# Patient Record
Sex: Female | Born: 1990 | Race: Black or African American | Hispanic: No | Marital: Married | Smoking: Never smoker
Health system: Southern US, Community
[De-identification: ages and names within clinical notes are randomized; demographics above are authoritative.]

## PROBLEM LIST (undated history)

## (undated) DIAGNOSIS — K5900 Constipation, unspecified: Secondary | ICD-10-CM

## (undated) DIAGNOSIS — N979 Female infertility, unspecified: Secondary | ICD-10-CM

## (undated) DIAGNOSIS — E282 Polycystic ovarian syndrome: Secondary | ICD-10-CM

## (undated) DIAGNOSIS — R519 Headache, unspecified: Secondary | ICD-10-CM

## (undated) DIAGNOSIS — E538 Deficiency of other specified B group vitamins: Secondary | ICD-10-CM

## (undated) DIAGNOSIS — M2550 Pain in unspecified joint: Secondary | ICD-10-CM

## (undated) DIAGNOSIS — N946 Dysmenorrhea, unspecified: Secondary | ICD-10-CM

## (undated) DIAGNOSIS — N920 Excessive and frequent menstruation with regular cycle: Secondary | ICD-10-CM

## (undated) DIAGNOSIS — D259 Leiomyoma of uterus, unspecified: Secondary | ICD-10-CM

## (undated) DIAGNOSIS — B029 Zoster without complications: Secondary | ICD-10-CM

## (undated) DIAGNOSIS — E559 Vitamin D deficiency, unspecified: Secondary | ICD-10-CM

## (undated) HISTORY — DX: Excessive and frequent menstruation with regular cycle: N92.0

## (undated) HISTORY — DX: Vitamin D deficiency, unspecified: E55.9

## (undated) HISTORY — DX: Deficiency of other specified B group vitamins: E53.8

## (undated) HISTORY — DX: Dysmenorrhea, unspecified: N94.6

## (undated) HISTORY — DX: Leiomyoma of uterus, unspecified: D25.9

## (undated) HISTORY — DX: Pain in unspecified joint: M25.50

## (undated) HISTORY — DX: Constipation, unspecified: K59.00

## (undated) HISTORY — DX: Zoster without complications: B02.9

## (undated) HISTORY — DX: Headache, unspecified: R51.9

## (undated) HISTORY — DX: Female infertility, unspecified: N97.9

---

## 2011-11-29 DIAGNOSIS — B029 Zoster without complications: Secondary | ICD-10-CM

## 2011-11-29 HISTORY — DX: Zoster without complications: B02.9

## 2017-02-02 ENCOUNTER — Other Ambulatory Visit: Payer: Self-pay | Admitting: Obstetrics and Gynecology

## 2017-02-02 DIAGNOSIS — N644 Mastodynia: Secondary | ICD-10-CM

## 2017-02-16 ENCOUNTER — Ambulatory Visit
Admission: RE | Admit: 2017-02-16 | Discharge: 2017-02-16 | Disposition: A | Discharge status: Home / Self Care | Payer: BLUE CROSS/BLUE SHIELD | Source: Ambulatory Visit | Attending: Obstetrics and Gynecology | Admitting: Obstetrics and Gynecology

## 2017-02-16 DIAGNOSIS — N644 Mastodynia: Secondary | ICD-10-CM

## 2017-08-24 ENCOUNTER — Encounter: Payer: Self-pay | Admitting: Family Medicine

## 2017-08-24 ENCOUNTER — Ambulatory Visit (INDEPENDENT_AMBULATORY_CARE_PROVIDER_SITE_OTHER): Payer: BLUE CROSS/BLUE SHIELD | Admitting: Family Medicine

## 2017-08-24 VITALS — BP 102/80 | HR 77 | Temp 98.1°F | Ht 66.0 in | Wt 216.7 lb

## 2017-08-24 DIAGNOSIS — Z7689 Persons encountering health services in other specified circumstances: Secondary | ICD-10-CM | POA: Diagnosis not present

## 2017-08-24 DIAGNOSIS — J302 Other seasonal allergic rhinitis: Secondary | ICD-10-CM

## 2017-08-24 NOTE — Progress Notes (Signed)
Patient presents to clinic today to establish care.  SUBJECTIVE: PMH: Pt is a 26 yo AAF with pmh sig for allergies.  Pt formally seen by [facility] in [place].  [facility] [place] where she had labs done in July.  History of allergies: -Symptoms occur a few times per year -Patient takes Zyrtec when necessary -Currently asymptomatic  Past surgical history: None  Allergies: NKDA  Social: Patient has been married for 2 years. She and her husband have lived in [place] for the last 2 years that have been going back to [place] for medical care. Patient does not have any children. 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 Patient attended [employer] for undergrad and [employer] for grad school. Patient denies tobacco, alcohol, drug use.  Family medical history: Mom: AAW Dad: HTN, renal cancer, DM Sis: AAW MGM: No issues Patient's maternal aunt: Breast cancer MGF: HTN, DM PGM: Lung cancer PGF: Not sure  Health Maintenance: Dental --Dr. Evette Doffing [facility] --my eye doctor on [address] Immunizations --declined flu shot, does not typically get Colonoscopy --N/a Mammogram --N/a PAP -- this year Bone Density -- N/a   Past Medical History:  Diagnosis Date  . Shingles 2013    History reviewed. No pertinent surgical history.  No current outpatient prescriptions on file prior to visit.   No current facility-administered medications on file prior to visit.     No Known Allergies  Family History  Problem Relation Age of Onset  . Breast cancer Maternal Aunt   . Breast cancer Paternal Aunt   . Kidney cancer Father   . Diabetes Father   . Hearing loss Father   . Hypertension Father     Social History   Social History  . Marital status: Married    Spouse name: N/A  . Number of children: N/A  . Years of education: N/A   Occupational History  . Not on file.    Social History Main Topics  . Smoking status: Never Smoker  . Smokeless tobacco: Never Used  . Alcohol use No  . Drug use: No  . Sexual activity: Not on file   Other Topics Concern  . Not on file   Social History Narrative  . No narrative on file    ROS General: Denies fever, chills, night sweats, changes in weight, changes in appetite HEENT: Denies headaches, ear pain, changes in vision, rhinorrhea, sore throat CV: Denies CP, palpitations, SOB, orthopnea Pulm: Denies SOB, cough, wheezing GI: Denies abdominal pain, nausea, vomiting, diarrhea, constipation GU: Denies dysuria, hematuria, frequency, vaginal discharge Msk: Denies muscle cramps, joint pains Neuro: Denies weakness, numbness, tingling Skin: Denies rashes, bruising Psych: Denies depression, anxiety, hallucinations  BP 102/80 (BP Location: Right Arm, Patient Position: Sitting, Cuff Size: Normal)   Pulse 77   Temp 98.1 F (36.7 C) (Oral)   Ht 5\' 6"  (1.676 m)   Wt 216 lb 11.2 oz (98.3 kg)   LMP 07/27/2017 (Exact Date)   BMI 34.98 kg/m   Physical Exam  Gen. Pleasant, well developed, well-nourished, in NAD HEENT - [place]/AT, PERRL, EOMI, conjunctive clear, no scleral icterus, no nasal drainage, pharynx without erythema or exudate. B/l TMs normal. Neck: No JVD, no thyromegaly Lungs: no use of accessory muscles, CTAB, no wheezes, rales or rhonchi Cardiovascular: RRR,  No r/g/m, no peripheral edema Abdomen: BS present, soft, nontender,nondistended, no hepatosplenomegaly Musculoskeletal: No deformities, moves all four extremities, no cyanosis or clubbing,  normal tone Neuro:  A&Ox3, CN II-XII intact, normal gait Skin:  Warm, dry, intact, no lesions Psych: normal affect, mood appropriate  No results found for this or any previous visit (from the past 2160 hour(s)).  Assessment/Plan: Seasonal allergic rhinitis, unspecified trigger -Continue Zyrtec prn -given handout  Encounter to establish care -pt to sign records  release form  F/u prn

## 2017-09-28 ENCOUNTER — Ambulatory Visit (INDEPENDENT_AMBULATORY_CARE_PROVIDER_SITE_OTHER): Payer: BLUE CROSS/BLUE SHIELD | Admitting: Family Medicine

## 2017-09-28 VITALS — BP 100/70 | HR 77 | Temp 98.1°F | Wt 218.1 lb

## 2017-09-28 DIAGNOSIS — S161XXA Strain of muscle, fascia and tendon at neck level, initial encounter: Secondary | ICD-10-CM | POA: Diagnosis not present

## 2017-09-28 MED ORDER — PREDNISONE 10 MG PO TABS: ORAL_TABLET | ORAL | 0 refills | Status: DC

## 2017-09-28 MED ORDER — CYCLOBENZAPRINE HCL 5 MG PO TABS: 5.0000 mg | ORAL_TABLET | Freq: Every day | ORAL | 0 refills | Status: DC

## 2017-09-28 MED ORDER — MELOXICAM 7.5 MG PO TABS: 7.5000 mg | ORAL_TABLET | Freq: Every day | ORAL | 0 refills | Status: DC

## 2017-09-28 NOTE — Patient Instructions (Addendum)
Muscle Strain A muscle strain (pulled muscle) happens when a muscle is stretched beyond normal length. It happens when a sudden, violent force stretches your muscle too far. Usually, a few of the fibers in your muscle are torn. Muscle strain is common in athletes. Recovery usually takes 1-2 weeks. Complete healing takes 5-6 weeks. Follow these instructions at home:  Follow the PRICE method of treatment to help your injury get better. Do this the first 2-3 days after the injury: ? Protect. Protect the muscle to keep it from getting injured again. ? Rest. Limit your activity and rest the injured body part. ? Ice. Put ice in a plastic bag. Place a towel between your skin and the bag. Then, apply the ice and leave it on from 15-20 minutes each hour. After the third day, switch to moist heat packs. ? Compression. Use a splint or elastic bandage on the injured area for comfort. Do not put it on too tightly. ? Elevate. Keep the injured body part above the level of your heart.  Only take medicine as told by your doctor.  Warm up before doing exercise to prevent future muscle strains. Contact a doctor if:  You have more pain or puffiness (swelling) in the injured area.  You feel numbness, tingling, or notice a loss of strength in the injured area. This information is not intended to replace advice given to you by your health care provider. Make sure you discuss any questions you have with your health care provider. Document Released: 08/23/2008 Document Revised: 04/21/2016 Document Reviewed: 06/13/2013 Elsevier Interactive Patient Education  2017 Elsevier Inc[employer]  Cervical Strain and Sprain Rehab Ask your health care provider which exercises are safe for you. Do exercises exactly as told by your health care provider and adjust them as directed. It is normal to feel mild stretching, pulling, tightness, or discomfort as you do these exercises, but you should stop right away if you feel sudden pain or your  pain gets worse.Do not begin these exercises until told by your health care provider. Stretching and range of motion exercises These exercises warm up your muscles and joints and improve the movement and flexibility of your neck. These exercises also help to relieve pain, numbness, and tingling. Exercise A: Cervical side bend  1. Using good posture, sit on a stable chair or stand up. 2. Without moving your shoulders, slowly tilt your left / right ear to your shoulder until you feel a stretch in your neck muscles. You should be looking straight ahead. 3. Hold for __________ seconds. 4. Repeat with the other side of your neck. Repeat __________ times. Complete this exercise __________ times a day. Exercise B: Cervical rotation  1. Using good posture, sit on a stable chair or stand up. 2. Slowly turn your head to the side as if you are looking over your left / right shoulder. ? Keep your eyes level with the ground. ? Stop when you feel a stretch along the side and the back of your neck. 3. Hold for __________ seconds. 4. Repeat this by turning to your other side. Repeat __________ times. Complete this exercise __________ times a day. Exercise C: Thoracic extension and pectoral stretch 1. Roll a towel or a small blanket so it is about 4 inches (10 cm) in diameter. 2. Lie down on your back on a firm surface. 3. Put the towel lengthwise, under your spine in the middle of your back. It should not be not under your shoulder blades. The towel   should line up with your spine from your middle back to your lower back. 4. Put your hands behind your head and let your elbows fall out to your sides. 5. Hold for __________ seconds. Repeat __________ times. Complete this exercise __________ times a day. Strengthening exercises These exercises build strength and endurance in your neck. Endurance is the ability to use your muscles for a long time, even after your muscles get tired. Exercise D: Upper cervical  flexion, isometric 1. Lie on your back with a thin pillow behind your head and a small rolled-up towel under your neck. 2. Gently tuck your chin toward your chest and nod your head down to look toward your feet. Do not lift your head off the pillow. 3. Hold for __________ seconds. 4. Release the tension slowly. Relax your neck muscles completely before you repeat this exercise. Repeat __________ times. Complete this exercise __________ times a day. Exercise E: Cervical extension, isometric  1. Stand about 6 inches (15 cm) away from a wall, with your back facing the wall. 2. Place a soft object, about 6-8 inches (15-20 cm) in diameter, between the back of your head and the wall. A soft object could be a small pillow, a ball, or a folded towel. 3. Gently tilt your head back and press into the soft object. Keep your jaw and forehead relaxed. 4. Hold for __________ seconds. 5. Release the tension slowly. Relax your neck muscles completely before you repeat this exercise. Repeat __________ times. Complete this exercise __________ times a day. Posture and body mechanics  Body mechanics refers to the movements and positions of your body while you do your daily activities. Posture is part of body mechanics. Good posture and healthy body mechanics can help to relieve stress in your body's tissues and joints. Good posture means that your spine is in its natural S-curve position (your spine is neutral), your shoulders are pulled back slightly, and your head is not tipped forward. The following are general guidelines for applying improved posture and body mechanics to your everyday activities. Standing  When standing, keep your spine neutral and keep your feet about hip-width apart. Keep a slight bend in your knees. Your ears, shoulders, and hips should line up.  When you do a task in which you stand in one place for a long time, place one foot up on a stable object that is 2-4 inches (5-10 cm) high, such  as a footstool. This helps keep your spine neutral. Sitting   When sitting, keep your spine neutral and your keep feet flat on the floor. Use a footrest, if necessary, and keep your thighs parallel to the floor. Avoid rounding your shoulders, and avoid tilting your head forward.  When working at a desk or a computer, keep your desk at a height where your hands are slightly lower than your elbows. Slide your chair under your desk so you are close enough to maintain good posture.  When working at a computer, place your monitor at a height where you are looking straight ahead and you do not have to tilt your head forward or downward to look at the screen. Resting When lying down and resting, avoid positions that are most painful for you. Try to support your neck in a neutral position. You can use a contour pillow or a small rolled-up towel. Your pillow should support your neck but not push on it. This information is not intended to replace advice given to you by your health care   provider. Make sure you discuss any questions you have with your health care provider. Document Released: 11/14/2005 Document Revised: 07/21/2016 Document Reviewed: 10/21/2015 Elsevier Interactive Patient Education  2018 Elsevier Inc.  

## 2017-09-28 NOTE — Progress Notes (Signed)
Subjective:    Patient ID: Paige Stout, female    DOB: Dec 26, 1990, 26 y.o.   MRN: [id number]  Chief Complaint  Patient presents with  . Neck Pain    HPI Patient was seen today for neck pain x 1 month.  Pt does not Recall injury. Pt has tried heat, ibuprofen. They initially started working been stopped. Pt reports difficulty turning her head to see in her blind spot while driving. Pt denies fever, chills, headache, nausea vomiting, dizziness. Pt endorses muscle in back of neck feeling tight, pain at the base of her skull with turning her neck.  Pt also expresses difficulty getting comfortable in bed at night.  Past Medical History:  Diagnosis Date  . Shingles 2013    No Known Allergies  ROS General: Denies fever, chills, night sweats, changes in weight, changes in appetite HEENT: Denies headaches, ear pain, changes in vision, rhinorrhea, sore throat CV: Denies CP, palpitations, SOB, orthopnea Pulm: Denies SOB, cough, wheezing GI: Denies abdominal pain, nausea, vomiting, diarrhea, constipation GU: Denies dysuria, hematuria, frequency, vaginal discharge Msk: Denies muscle cramps, joint pains +neck pain Neuro: Denies weakness, numbness, tingling Skin: Denies rashes, bruising Psych: Denies depression, anxiety, hallucinations     Objective:    Blood pressure 100/70, pulse 77, temperature 98.1 F (36.7 C), temperature source Oral, weight 218 lb 1.6 oz (98.9 kg).   Gen. Pleasant, well-nourished, in no distress, normal affect  HEENT: [place]/AT, face symmetric, conjunctiva clear, no scleral icterus, PERRLA, EOMI, nares patent Lungs: no accessory muscle use, CTAB, no wheezes or rales Cardiovascular: RRR, no m/r/g, no peripheral edema Musculoskeletal: No deformities, no cyanosis or clubbing, normal tone.  No TTP of spine.  Shoulders symmetric, No TTP of trapezius.  Trapezius muscle tense. Neuro:  A&Ox3, CN II-XII intact, normal gait Skin:  Warm, no lesions/ rash   Wt Readings from  Last 3 Encounters:  09/28/17 218 lb 1.6 oz (98.9 kg)  08/24/17 216 lb 11.2 oz (98.3 kg)    No results found for: WBC, HGB, HCT, PLT, GLUCOSE, CHOL, TRIG, HDL, LDLDIRECT, LDLCALC, ALT, AST, NA, K, CL, CREATININE, BUN, CO2, TSH, PSA, INR, GLUF, HGBA1C, MICROALBUR  Assessment/Plan:  Strain of neck muscle, initial encounter  -discussed continuing ways to reduce stress. -Will proceed with muscle relaxer at night, prednisone 5 day taper, and mobic prn. -if symptoms continue or become worse RTC. -will consider xray if symptoms continue. - Plan: predniSONE (DELTASONE) 10 MG tablet, cyclobenzaprine (FLEXERIL) 5 MG tablet, meloxicam (MOBIC) 7.5 MG tablet

## 2018-07-27 ENCOUNTER — Telehealth: Payer: Self-pay | Admitting: Family Medicine

## 2018-07-27 NOTE — Telephone Encounter (Signed)
Okay 

## 2018-07-27 NOTE — Telephone Encounter (Signed)
See request °

## 2018-07-27 NOTE — Telephone Encounter (Signed)
Please advise on taking patient.

## 2018-07-27 NOTE — Telephone Encounter (Signed)
ok 

## 2018-07-27 NOTE — Telephone Encounter (Signed)
Copied from [place] [id number]. Topic: Quick Communication - See Telephone Encounter >> Jul 27, 2018  1:03 PM Hewitt Shorts wrote: Pt is wanting to change PCP from [facility] to [facility] because she wants to try the weight loss programs that wallace  Best number is [phone]

## 2018-07-28 NOTE — Telephone Encounter (Signed)
Can you please schedule?

## 2018-07-31 NOTE — Telephone Encounter (Signed)
Called pt and left vm for pt to schedule TOC appt.

## 2018-09-18 DIAGNOSIS — N390 Urinary tract infection, site not specified: Secondary | ICD-10-CM | POA: Diagnosis not present

## 2018-09-18 DIAGNOSIS — R102 Pelvic and perineal pain: Secondary | ICD-10-CM | POA: Diagnosis not present

## 2018-09-18 DIAGNOSIS — M545 Low back pain: Secondary | ICD-10-CM | POA: Diagnosis not present

## 2018-09-19 DIAGNOSIS — N946 Dysmenorrhea, unspecified: Secondary | ICD-10-CM

## 2018-09-19 HISTORY — DX: Dysmenorrhea, unspecified: N94.6

## 2018-09-28 DIAGNOSIS — R102 Pelvic and perineal pain: Secondary | ICD-10-CM | POA: Diagnosis not present

## 2018-09-28 DIAGNOSIS — N946 Dysmenorrhea, unspecified: Secondary | ICD-10-CM | POA: Diagnosis not present

## 2018-09-28 DIAGNOSIS — N920 Excessive and frequent menstruation with regular cycle: Secondary | ICD-10-CM | POA: Diagnosis not present

## 2018-09-29 DIAGNOSIS — D259 Leiomyoma of uterus, unspecified: Secondary | ICD-10-CM

## 2018-09-29 DIAGNOSIS — R102 Pelvic and perineal pain: Secondary | ICD-10-CM | POA: Insufficient documentation

## 2018-09-29 HISTORY — DX: Leiomyoma of uterus, unspecified: D25.9

## 2018-09-30 NOTE — Progress Notes (Deleted)
   Paige Stout is a 27 y.o. female is here to establish care.  History of Present Illness:   HPI: See Assessment and Plan section for Problem Based Charting of issues discussed today.   Health Maintenance Due  Topic Date Due  . HIV Screening  12/31/2003  . TETANUS/TDAP  12/31/2007  . INFLUENZA VACCINE  06/28/2018   Depression screen PHQ 2/9 08/24/2017  Decreased Interest 0  Down, Depressed, Hopeless 0  PHQ - 2 Score 0   PMHx, SurgHx, SocialHx, FamHx, Medications, and Allergies were reviewed in the Visit Navigator and updated as appropriate.  There are no active problems to display for this patient.  Social History   Tobacco Use  . Smoking status: Never Smoker  . Smokeless tobacco: Never Used  Substance Use Topics  . Alcohol use: No  . Drug use: No   Current Medications and Allergies:   Current Outpatient Medications:  .  cyclobenzaprine (FLEXERIL) 5 MG tablet, Take 1 tablet (5 mg total) by mouth at bedtime., Disp: 15 tablet, Rfl: 0 .  meloxicam (MOBIC) 7.5 MG tablet, Take 1 tablet (7.5 mg total) by mouth daily., Disp: 30 tablet, Rfl: 0 .  predniSONE (DELTASONE) 10 MG tablet, Take 5 tabs on day 1, 4 tabs on day 2, 3 tabs on day 3, 2 tabs on day 4, 1 tab on day 5., Disp: 15 tablet, Rfl: 0  No Known Allergies Review of Systems   Pertinent items are noted in the HPI. Otherwise, ROS is negative.  Vitals:  There were no vitals filed for this visit.   There is no height or weight on file to calculate BMI.  Physical Exam:   Physical Exam  No results found for this or any previous visit.  Assessment and Plan:   No problem-specific Assessment & Plan notes found for this encounter.   No orders of the defined types were placed in this encounter.   No orders of the defined types were placed in this encounter.   . Reviewed expectations re: course of current medical issues. . Discussed self-management of symptoms. . Outlined signs and symptoms indicating need for  more acute intervention. . Patient verbalized understanding and all questions were answered. Marland Kitchen Health Maintenance issues including appropriate healthy diet, exercise, and smoking avoidance were discussed with patient. . See orders for this visit as documented in the electronic medical record. . Patient received an After Visit Summary.  Briscoe Deutscher, DO [place] 09/30/2018

## 2018-10-01 ENCOUNTER — Encounter: Payer: BLUE CROSS/BLUE SHIELD | Admitting: Family Medicine

## 2018-10-02 ENCOUNTER — Encounter

## 2018-10-02 ENCOUNTER — Ambulatory Visit: Payer: Self-pay | Admitting: Family Medicine

## 2018-10-02 ENCOUNTER — Encounter: Payer: Self-pay | Admitting: Family Medicine

## 2018-10-02 VITALS — BP 122/84 | HR 88 | Temp 98.0°F | Ht 67.0 in | Wt 217.4 lb

## 2018-10-02 DIAGNOSIS — Z13220 Encounter for screening for lipoid disorders: Secondary | ICD-10-CM

## 2018-10-02 DIAGNOSIS — R5383 Other fatigue: Secondary | ICD-10-CM

## 2018-10-02 DIAGNOSIS — E282 Polycystic ovarian syndrome: Secondary | ICD-10-CM

## 2018-10-02 DIAGNOSIS — E66811 Obesity, class 1: Secondary | ICD-10-CM

## 2018-10-02 DIAGNOSIS — R5381 Other malaise: Secondary | ICD-10-CM | POA: Diagnosis not present

## 2018-10-02 DIAGNOSIS — E669 Obesity, unspecified: Secondary | ICD-10-CM

## 2018-10-02 DIAGNOSIS — R739 Hyperglycemia, unspecified: Secondary | ICD-10-CM | POA: Diagnosis not present

## 2018-10-02 DIAGNOSIS — Z9189 Other specified personal risk factors, not elsewhere classified: Secondary | ICD-10-CM

## 2018-10-02 LAB — POCT GLYCOSYLATED HEMOGLOBIN (HGB A1C): Hemoglobin A1C: 5.5 % (ref 4.0–5.6)

## 2018-10-02 LAB — CBC WITH DIFFERENTIAL/PLATELET
Basophils Absolute: 0.1 10*3/uL (ref 0.0–0.1)
Basophils Relative: 0.7 % (ref 0.0–3.0)
Eosinophils Absolute: 0.2 10*3/uL (ref 0.0–0.7)
Eosinophils Relative: 2.1 % (ref 0.0–5.0)
HCT: 37.3 % (ref 36.0–46.0)
Hemoglobin: 12.6 g/dL (ref 12.0–15.0)
Lymphocytes Relative: 40 % (ref 12.0–46.0)
Lymphs Abs: 4.1 10*3/uL — ABNORMAL HIGH (ref 0.7–4.0)
MCHC: 33.6 g/dL (ref 30.0–36.0)
MCV: 85.9 fl (ref 78.0–100.0)
Monocytes Absolute: 0.5 10*3/uL (ref 0.1–1.0)
Monocytes Relative: 4.5 % (ref 3.0–12.0)
Neutro Abs: 5.4 10*3/uL (ref 1.4–7.7)
Neutrophils Relative %: 52.7 % (ref 43.0–77.0)
Platelets: 351 10*3/uL (ref 150.0–400.0)
RBC: 4.35 Mil/uL (ref 3.87–5.11)
RDW: 13.8 % (ref 11.5–15.5)
WBC: 10.3 10*3/uL (ref 4.0–10.5)

## 2018-10-02 LAB — COMPREHENSIVE METABOLIC PANEL
ALT: 24 U/L (ref 0–35)
AST: 23 U/L (ref 0–37)
Albumin: 4.3 g/dL (ref 3.5–5.2)
Alkaline Phosphatase: 60 U/L (ref 39–117)
BUN: 6 mg/dL (ref 6–23)
CO2: 25 mEq/L (ref 19–32)
Calcium: 9.5 mg/dL (ref 8.4–10.5)
Chloride: 102 mEq/L (ref 96–112)
Creatinine, Ser: 0.61 mg/dL (ref 0.40–1.20)
GFR: 148.35 mL/min (ref >60.00)
Glucose, Bld: 94 mg/dL (ref 70–99)
Potassium: 4.2 mEq/L (ref 3.5–5.1)
Sodium: 136 mEq/L (ref 135–145)
Total Bilirubin: 0.5 mg/dL (ref 0.2–1.2)
Total Protein: 7.5 g/dL (ref 6.0–8.3)

## 2018-10-02 LAB — LIPID PANEL
Cholesterol: 181 mg/dL (ref 0–200)
HDL: 56.3 mg/dL (ref >39.00)
LDL Cholesterol: 98 mg/dL (ref 0–99)
NonHDL: 125.04
Total CHOL/HDL Ratio: 3
Triglycerides: 135 mg/dL (ref 0.0–149.0)
VLDL: 27 mg/dL (ref 0.0–40.0)

## 2018-10-02 LAB — TSH: TSH: 1.26 u[IU]/mL (ref 0.35–4.50)

## 2018-10-02 NOTE — Progress Notes (Signed)
Paige Stout is a 27 y.o. female is here to transfer care.  History of Present Illness:   HPI: See Assessment and Plan section for Problem Based Charting of issues discussed today.   There are no preventive care reminders to display for this patient.   Depression screen [facility] 2/9 10/04/2018 08/24/2017  Decreased Interest 0 0  Down, Depressed, Hopeless 0 0  PHQ - 2 Score 0 0   PMHx, SurgHx, SocialHx, FamHx, Medications, and Allergies were reviewed in the Visit Navigator and updated as appropriate.   Patient Active Problem List   Diagnosis Date Noted  . Menorrhagia 10/04/2018  . At risk for infertility 10/04/2018  . Obesity (BMI 30.0-34.9) 10/04/2018  . PCOS (polycystic ovarian syndrome) suspected 10/04/2018  . Uterine leiomyoma 09/29/2018  . Dysmenorrhea 09/19/2018   Social History   Tobacco Use  . Smoking status: Never Smoker  . Smokeless tobacco: Never Used  Substance Use Topics  . Alcohol use: No  . Drug use: No   Current Medications and Allergies:   Current Outpatient Medications:  .  phentermine (ADIPEX-P) 37.5 MG tablet, Take 1 tablet (37.5 mg total) by mouth daily before breakfast., Disp: 30 tablet, Rfl: 0 .  Prenatal Vit-Fe Fumarate-FA (PRENATAL VITAMIN PLUS LOW IRON PO), Take 1 tablet by mouth daily., Disp: , Rfl:   No Known Allergies Review of Systems   Pertinent items are noted in the HPI. Otherwise, ROS is negative.  Vitals:   Vitals:   10/02/18 0932  BP: 122/84  Pulse: 88  Temp: 98 F (36.7 C)  TempSrc: Oral  SpO2: 97%  Weight: 217 lb 6.4 oz (98.6 kg)  Height: 5\' 7"  (1.702 m)     Body mass index is 34.05 kg/m.  Physical Exam:   Physical Exam  Constitutional: She appears well-nourished.  HENT:  Head: Normocephalic and atraumatic.  Eyes: Pupils are equal, round, and reactive to light. EOM are normal.  Neck: Normal range of motion. Neck supple.  Cardiovascular: Normal rate, regular rhythm, normal heart sounds and intact distal pulses.    Pulmonary/Chest: Effort normal.  Abdominal: Soft.  Skin: Skin is warm.  Psychiatric: She has a normal mood and affect. Her behavior is normal.  Nursing note and vitals reviewed.  Results for orders placed or performed in visit on 10/02/18  CBC with Differential/Platelet  Result Value Ref Range   WBC 10.3 4.0 - 10.5 K/uL   RBC 4.35 3.87 - 5.11 Mil/uL   Hemoglobin 12.6 12.0 - 15.0 g/dL   HCT 37.3 36.0 - 46.0 %   MCV 85.9 78.0 - 100.0 fl   MCHC 33.6 30.0 - 36.0 g/dL   RDW 13.8 11.5 - 15.5 %   Platelets 351.0 150.0 - 400.0 K/uL   Neutrophils Relative % 52.7 43.0 - 77.0 %   Lymphocytes Relative 40.0 12.0 - 46.0 %   Monocytes Relative 4.5 3.0 - 12.0 %   Eosinophils Relative 2.1 0.0 - 5.0 %   Basophils Relative 0.7 0.0 - 3.0 %   Neutro Abs 5.4 1.4 - 7.7 K/uL   Lymphs Abs 4.1 (H) 0.7 - 4.0 K/uL   Monocytes Absolute 0.5 0.1 - 1.0 K/uL   Eosinophils Absolute 0.2 0.0 - 0.7 K/uL   Basophils Absolute 0.1 0.0 - 0.1 K/uL  Comprehensive metabolic panel  Result Value Ref Range   Sodium 136 135 - 145 mEq/L   Potassium 4.2 3.5 - 5.1 mEq/L   Chloride 102 96 - 112 mEq/L   CO2 25 19 - 32  mEq/L   Glucose, Bld 94 70 - 99 mg/dL   BUN 6 6 - 23 mg/dL   Creatinine, Ser 0.61 0.40 - 1.20 mg/dL   Total Bilirubin 0.5 0.2 - 1.2 mg/dL   Alkaline Phosphatase 60 39 - 117 U/L   AST 23 0 - 37 U/L   ALT 24 0 - 35 U/L   Total Protein 7.5 6.0 - 8.3 g/dL   Albumin 4.3 3.5 - 5.2 g/dL   Calcium 9.5 8.4 - 10.5 mg/dL   GFR 148.35 >60.00 mL/min  Lipid panel  Result Value Ref Range   Cholesterol 181 0 - 200 mg/dL   Triglycerides 135.0 0.0 - 149.0 mg/dL   HDL 56.30 >39.00 mg/dL   VLDL 27.0 0.0 - 40.0 mg/dL   LDL Cholesterol 98 0 - 99 mg/dL   Total CHOL/HDL Ratio 3    NonHDL 125.04   TSH  Result Value Ref Range   TSH 1.26 0.35 - 4.50 uIU/mL  POCT glycosylated hemoglobin (Hb A1C)  Result Value Ref Range   Hemoglobin A1C 5.5 4.0 - 5.6 %   HbA1c POC (<> result, manual entry)     HbA1c, POC (prediabetic  range)     HbA1c, POC (controlled diabetic range)     Assessment and Plan:   Obesity (BMI 30.0-34.9) Patient complains of obesity. Patient cites wanting to become pregnant and overall health as reasons for wanting to lose weight.  Wt Readings from Last 3 Encounters:  10/02/18 217 lb 6.4 oz (98.6 kg)  09/28/17 218 lb 1.6 oz (98.9 kg)  08/24/17 216 lb 11.2 oz (98.3 kg)   Usual eating pattern includes: Pescatarian, admits to eating sweets.   Usual physical activity includes no dedicated exercise. Current life stressors: none.  Associated medical conditions: hirsutism and irregular menses.  Associated medications: none.   Cardiovascular risk factors besides obesity: obesity (BMI >= 30 kg/m2).  History of eating disorders: none.  Previous treatments for obesity include: self-directed dieting.   Contraindications to weight loss: none. Patient readiness to commit to diet and activity changes: excellent. Barriers to weight loss: none.  I assessed Franchon to be in an action stage with respect to weight loss.   Plan: 1. Diagnostic studies to rule out secondary causes of obesity: see orders. 2. General patient education:  a. Importance of long-term maintenance treatment in weight loss. b. Use non-food self-rewards to reinforce behavior changes. c. Elicit support from others; identify saboteurs. 3. Diet interventions:  a. Risks of dieting were reviewed, including fatigue, temporary hair loss, gallstone formation, gout, and with very low calorie diets, electrolyte abnormalities, nutrient inadequacies, and loss of lean body mass. 4. Exercise intervention:  a. Informal measures, e.g. taking stairs instead of elevator. b. Formal exercise regimen options discussed as well. 5. Other behavioral treatment: stress management. 6. Other treatment: Medication: Phentermine. 7. Patient to keep a weight log that we will review at follow up. 8. Follow up: 1 month and as needed.  At risk for  infertility [place] Criteria:  [x]   Oligo and/or anovulation [x]   Biochemical and/or clinical signs of hyperandrogenism      Clinical: Acne, hirsutism, acanthosis nigricans      Biochemical: Total T > 70 ng/dL, Androstenedione > 245 ng/dL, DHEA-S > 248 ug/dL []   Polycystic ovaries (> 12 follicles, 2-9 mm diameter each ovary/ ovarian volume > 10 cc)  PCOS is a syndrome consisting of: 1. weight gain 2. insulin resistance (and therefore a higher risk of developing diabetes later in life) 3. acne  4. hirsutism 5. irregular menstrual cycles 6. decreased fertility  We also discussed about the fact that the treatment is usually targeted to addressing the problem that concerns the patient the most: acne/hirsutism, weight gain, or fertility, but there is no single treatment for PCOS. In this patient, will focus on rapid weight loss. Explained that Phentermine is containdicated in pregnancy, so she will stop at the first sign of pregnancy.   Orders Placed This Encounter  Procedures  . CBC with Differential/Platelet  . Comprehensive metabolic panel  . Lipid panel  . TSH  . Insulin, Free (Bioactive)  . POCT glycosylated hemoglobin (Hb A1C)   Meds ordered this encounter  Medications  . phentermine (ADIPEX-P) 37.5 MG tablet    Sig: Take 1 tablet (37.5 mg total) by mouth daily before breakfast.    Dispense:  30 tablet    Refill:  0    . Reviewed expectations re: course of current medical issues. . Discussed self-management of symptoms. . Outlined signs and symptoms indicating need for more acute intervention. . Patient verbalized understanding and all questions were answered. Marland Kitchen Health Maintenance issues including appropriate healthy diet, exercise, and smoking avoidance were discussed with patient. . See orders for this visit as documented in the electronic medical record. . Patient received an After Visit Summary.  Briscoe Deutscher, DO [place], [facility] 10/04/2018

## 2018-10-04 ENCOUNTER — Encounter: Payer: Self-pay | Admitting: Family Medicine

## 2018-10-04 DIAGNOSIS — E669 Obesity, unspecified: Secondary | ICD-10-CM | POA: Insufficient documentation

## 2018-10-04 DIAGNOSIS — E282 Polycystic ovarian syndrome: Secondary | ICD-10-CM | POA: Insufficient documentation

## 2018-10-04 DIAGNOSIS — N920 Excessive and frequent menstruation with regular cycle: Secondary | ICD-10-CM

## 2018-10-04 DIAGNOSIS — Z9189 Other specified personal risk factors, not elsewhere classified: Secondary | ICD-10-CM | POA: Insufficient documentation

## 2018-10-04 HISTORY — DX: Excessive and frequent menstruation with regular cycle: N92.0

## 2018-10-04 MED ORDER — PHENTERMINE HCL 37.5 MG PO TABS: 37.5000 mg | ORAL_TABLET | Freq: Every day | ORAL | 0 refills | Status: DC

## 2018-10-04 NOTE — Assessment & Plan Note (Signed)
Patient complains of obesity. Patient cites wanting to become pregnant and overall health as reasons for wanting to lose weight.  Wt Readings from Last 3 Encounters:  10/02/18 217 lb 6.4 oz (98.6 kg)  09/28/17 218 lb 1.6 oz (98.9 kg)  08/24/17 216 lb 11.2 oz (98.3 kg)   Usual eating pattern includes: Pescatarian, admits to eating sweets.   Usual physical activity includes no dedicated exercise. Current life stressors: none.  Associated medical conditions: hirsutism and irregular menses.  Associated medications: none.   Cardiovascular risk factors besides obesity: obesity (BMI >= 30 kg/m2).  History of eating disorders: none.  Previous treatments for obesity include: self-directed dieting.   Contraindications to weight loss: none. Patient readiness to commit to diet and activity changes: excellent. Barriers to weight loss: none.  I assessed Reverie to be in an action stage with respect to weight loss.   Plan: 1. Diagnostic studies to rule out secondary causes of obesity: see orders. 2. General patient education:  a. Importance of long-term maintenance treatment in weight loss. b. Use non-food self-rewards to reinforce behavior changes. c. Elicit support from others; identify saboteurs. 3. Diet interventions:  a. Risks of dieting were reviewed, including fatigue, temporary hair loss, gallstone formation, gout, and with very low calorie diets, electrolyte abnormalities, nutrient inadequacies, and loss of lean body mass. 4. Exercise intervention:  a. Informal measures, e.g. taking stairs instead of elevator. b. Formal exercise regimen options discussed as well. 5. Other behavioral treatment: stress management. 6. Other treatment: Medication: Phentermine. 7. Patient to keep a weight log that we will review at follow up. 8. Follow up: 1 month and as needed.

## 2018-10-04 NOTE — Assessment & Plan Note (Signed)
[place] Criteria:  [x]   Oligo and/or anovulation [x]   Biochemical and/or clinical signs of hyperandrogenism      Clinical: Acne, hirsutism, acanthosis nigricans      Biochemical: Total T > 70 ng/dL, Androstenedione > 245 ng/dL, DHEA-S > 248 ug/dL []   Polycystic ovaries (> 12 follicles, 2-9 mm diameter each ovary/ ovarian volume > 10 cc)  PCOS is a syndrome consisting of: 1. weight gain 2. insulin resistance (and therefore a higher risk of developing diabetes later in life) 3. acne 4. hirsutism 5. irregular menstrual cycles 6. decreased fertility  We also discussed about the fact that the treatment is usually targeted to addressing the problem that concerns the patient the most: acne/hirsutism, weight gain, or fertility, but there is no single treatment for PCOS. In this patient, will focus on rapid weight loss. Explained that Phentermine is containdicated in pregnancy, so she will stop at the first sign of pregnancy.

## 2018-10-11 LAB — INSULIN, FREE (BIOACTIVE): Insulin, Free: 11.5 u[IU]/mL (ref 1.5–14.9)

## 2018-10-15 ENCOUNTER — Other Ambulatory Visit: Payer: Self-pay

## 2018-10-15 MED ORDER — METFORMIN HCL ER 750 MG PO TB24: 750.0000 mg | ORAL_TABLET | Freq: Two times a day (BID) | ORAL | 3 refills | Status: DC

## 2018-10-19 ENCOUNTER — Other Ambulatory Visit: Payer: Self-pay | Admitting: Obstetrics and Gynecology

## 2018-10-23 ENCOUNTER — Encounter (HOSPITAL_COMMUNITY): Payer: Self-pay | Admitting: *Deleted

## 2018-10-23 ENCOUNTER — Other Ambulatory Visit: Payer: Self-pay

## 2018-11-11 ENCOUNTER — Encounter (HOSPITAL_COMMUNITY): Payer: Self-pay | Admitting: Obstetrics and Gynecology

## 2018-11-11 ENCOUNTER — Telehealth: Payer: Self-pay | Admitting: Obstetrics and Gynecology

## 2018-11-11 NOTE — H&P (Signed)
Paige Stout is an 27 y.o. female who presents for laparoscopic evaluation of pelvic pain.  Pertinent Gynecological History: Menses: irregular occurring approximately every 5days days without intermenstrual spotting Bleeding: irreg menses only Contraception: none.  Pt wants to conceive DES exposure: denies Blood transfusions: none Sexually transmitted diseases: HPV only.  Completed Gardasil age 37 Previous GYN Procedures: none  Last mammogram: n/a Date: n/a Last pap: normal Date: 2018 OB History: Go, P0   Menstrual History: Menarche age: 1 Patient's last menstrual period was 11/02/18.  Cramping started on 10/31/18 and contuinued for 3 days into the cycle.  Changed every pad every 90 mins for the first two days.    Past Medical History:  Diagnosis Date  . Dysmenorrhea 09/19/2018  . Menorrhagia 10/04/2018  . PCOS (polycystic ovarian syndrome)   . Shingles 2013  . Uterine leiomyoma 09/29/2018    History reviewed. No pertinent surgical history.  Family History  Problem Relation Age of Onset  . Breast cancer Maternal Aunt   . Breast cancer Paternal Aunt   . Kidney cancer Father   . Diabetes Father   . Hearing loss Father   . Hypertension Father     Social History:  reports that she has never smoked. She has never used smokeless tobacco. She reports that she does not drink alcohol or use drugs.  Allergies: No Known Allergies  Medications Prior to Admission  Medication Sig Dispense Refill Last Dose  . acetaminophen (TYLENOL) 500 MG tablet Take 1,000 mg by mouth every 6 (six) hours as needed for moderate pain.   Past Week at Unknown time  . ibuprofen (ADVIL,MOTRIN) 800 MG tablet Take 800 mg by mouth every 8 (eight) hours as needed for moderate pain.   Past Month at Unknown time  . Prenatal Vit-Fe Fumarate-FA (PRENATAL VITAMIN PLUS LOW IRON PO) Take 1 tablet by mouth daily.   Past Month at Unknown time  . metFORMIN (GLUCOPHAGE XR) 750 MG 24 hr tablet Take 1 tablet (750 mg  total) by mouth 2 (two) times daily. 180 tablet 3   . phentermine (ADIPEX-P) 37.5 MG tablet Take 1 tablet (37.5 mg total) by mouth daily before breakfast. 30 tablet 0 More than a month at Unknown time    Review of Systems  Constitutional: Negative.   Respiratory: Negative.   Cardiovascular: Negative.   Gastrointestinal: Negative.   Endo/Heme/Allergies:       Irregular menses Cramping with menses that can begin as much as 3 days prior to bleeding. Elevated Hgb A1c with recent start of metformin  Psychiatric/Behavioral: Negative.     Height 5\' 7"  (1.702 m), weight 97.5 kg, last menstrual period 11/02/2018. Physical Exam  Constitutional: She appears well-developed and well-nourished.  HENT:  Head: Normocephalic and atraumatic.  Eyes: EOM are normal.  Neck: Normal range of motion. Neck supple.  Cardiovascular: Normal rate and regular rhythm.  Respiratory: Effort normal and breath sounds normal.  GI: Soft. Bowel sounds are normal.  Genitourinary:    Genitourinary Comments: Pelvic exam: normal external genitalia, vulva, vagina, cervix, uterus and adnexa.  Some uterosacral tendernes.  No masses or nodules on rectovaginal exam.     Results for orders placed or performed during the hospital encounter of 11/12/18 (from the past 24 hour(s))  Pregnancy, urine     Status: None   Collection Time: 11/12/18 11:04 AM  Result Value Ref Range   Preg Test, Ur NEGATIVE NEGATIVE  CBC     Status: None   Collection Time: 11/12/18 11:10 AM  Result Value Ref Range   WBC 10.5 4.0 - 10.5 K/uL   RBC 4.48 3.87 - 5.11 MIL/uL   Hemoglobin 13.3 12.0 - 15.0 g/dL   HCT 38.9 36.0 - 46.0 %   MCV 86.8 80.0 - 100.0 fL   MCH 29.7 26.0 - 34.0 pg   MCHC 34.2 30.0 - 36.0 g/dL   RDW 13.1 11.5 - 15.5 %   Platelets 392 150 - 400 K/uL   nRBC 0.0 0.0 - 0.2 %     Assessment/Plan: Pt presents with increasingly severe pre and menstrual cramps over the last 4 mos.  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  Pt has opted to proceed with diagnostic laparoscopy for further evaluation.  We have discussed the risks of surgery which include but are not not limited to anesthesia, bleeding, infection, damage to adjacent organs, and the risk that no cause for the patient's pelvic pain will be noted during laparoscopy.  She voices understanding and wishes to proceed.  Seymour Bars  11/12/2018, 1:07 PM

## 2018-11-12 ENCOUNTER — Ambulatory Visit (HOSPITAL_COMMUNITY): Payer: 59 | Admitting: Anesthesiology

## 2018-11-12 ENCOUNTER — Ambulatory Visit (HOSPITAL_COMMUNITY)
Admission: RE | Admit: 2018-11-12 | Discharge: 2018-11-12 | Disposition: A | Discharge status: Home / Self Care | Payer: 59 | Source: Ambulatory Visit | Attending: Obstetrics and Gynecology | Admitting: Obstetrics and Gynecology

## 2018-11-12 ENCOUNTER — Other Ambulatory Visit: Payer: Self-pay

## 2018-11-12 ENCOUNTER — Encounter (HOSPITAL_COMMUNITY)
Admission: RE | Disposition: A | Discharge status: Home / Self Care | Payer: Self-pay | Source: Ambulatory Visit | Attending: Obstetrics and Gynecology

## 2018-11-12 ENCOUNTER — Encounter (HOSPITAL_COMMUNITY): Payer: Self-pay

## 2018-11-12 DIAGNOSIS — R102 Pelvic and perineal pain: Secondary | ICD-10-CM | POA: Insufficient documentation

## 2018-11-12 DIAGNOSIS — N838 Other noninflammatory disorders of ovary, fallopian tube and broad ligament: Secondary | ICD-10-CM | POA: Diagnosis not present

## 2018-11-12 DIAGNOSIS — D259 Leiomyoma of uterus, unspecified: Secondary | ICD-10-CM | POA: Insufficient documentation

## 2018-11-12 DIAGNOSIS — N946 Dysmenorrhea, unspecified: Secondary | ICD-10-CM | POA: Insufficient documentation

## 2018-11-12 DIAGNOSIS — Z79899 Other long term (current) drug therapy: Secondary | ICD-10-CM | POA: Diagnosis not present

## 2018-11-12 DIAGNOSIS — Z7984 Long term (current) use of oral hypoglycemic drugs: Secondary | ICD-10-CM | POA: Insufficient documentation

## 2018-11-12 HISTORY — DX: Polycystic ovarian syndrome: E28.2

## 2018-11-12 HISTORY — PX: LAPAROSCOPY: SHX197

## 2018-11-12 LAB — CBC
HCT: 38.9 % (ref 36.0–46.0)
Hemoglobin: 13.3 g/dL (ref 12.0–15.0)
MCH: 29.7 pg (ref 26.0–34.0)
MCHC: 34.2 g/dL (ref 30.0–36.0)
MCV: 86.8 fL (ref 80.0–100.0)
PLATELETS: 392 10*3/uL (ref 150–400)
RBC: 4.48 MIL/uL (ref 3.87–5.11)
RDW: 13.1 % (ref 11.5–15.5)
WBC: 10.5 10*3/uL (ref 4.0–10.5)
nRBC: 0 % (ref 0.0–0.2)

## 2018-11-12 LAB — PREGNANCY, URINE: PREG TEST UR: NEGATIVE

## 2018-11-12 SURGERY — LAPAROSCOPY OPERATIVE
Anesthesia: General | Site: Abdomen

## 2018-11-12 MED ORDER — KETOROLAC TROMETHAMINE 30 MG/ML IJ SOLN
INTRAMUSCULAR | Status: DC | PRN
  Administered 2018-11-12: 30 mg via INTRAVENOUS

## 2018-11-12 MED ORDER — ROCURONIUM BROMIDE 100 MG/10ML IV SOLN
INTRAVENOUS | Status: AC
  Filled 2018-11-12: qty 1

## 2018-11-12 MED ORDER — HYDROMORPHONE HCL 1 MG/ML IJ SOLN
INTRAMUSCULAR | Status: AC
  Filled 2018-11-12: qty 1

## 2018-11-12 MED ORDER — BUPIVACAINE HCL (PF) 0.25 % IJ SOLN
INTRAMUSCULAR | Status: AC
  Filled 2018-11-12: qty 30

## 2018-11-12 MED ORDER — MIDAZOLAM HCL 2 MG/2ML IJ SOLN
INTRAMUSCULAR | Status: AC
  Filled 2018-11-12: qty 2

## 2018-11-12 MED ORDER — ACETAMINOPHEN 500 MG PO TABS
1000.0000 mg | ORAL_TABLET | Freq: Once | ORAL | Status: AC
  Administered 2018-11-12: 1000 mg via ORAL

## 2018-11-12 MED ORDER — MEPERIDINE HCL 25 MG/ML IJ SOLN: 6.2500 mg | INTRAMUSCULAR | Status: DC | PRN

## 2018-11-12 MED ORDER — LIDOCAINE HCL (CARDIAC) PF 100 MG/5ML IV SOSY
PREFILLED_SYRINGE | INTRAVENOUS | Status: AC
  Filled 2018-11-12: qty 5

## 2018-11-12 MED ORDER — DEXAMETHASONE SODIUM PHOSPHATE 4 MG/ML IJ SOLN
INTRAMUSCULAR | Status: DC | PRN
  Administered 2018-11-12: 4 mg via INTRAVENOUS

## 2018-11-12 MED ORDER — GLYCOPYRROLATE 0.2 MG/ML IJ SOLN
INTRAMUSCULAR | Status: DC | PRN
  Administered 2018-11-12: 0.2 mg via INTRAVENOUS

## 2018-11-12 MED ORDER — PROPOFOL 10 MG/ML IV BOLUS
INTRAVENOUS | Status: DC | PRN
  Administered 2018-11-12: 150 mg via INTRAVENOUS

## 2018-11-12 MED ORDER — OXYCODONE HCL 5 MG PO CAPS: ORAL_CAPSULE | ORAL | 0 refills | Status: DC

## 2018-11-12 MED ORDER — BUPIVACAINE HCL (PF) 0.5 % IJ SOLN
INTRAMUSCULAR | Status: AC
  Filled 2018-11-12: qty 30

## 2018-11-12 MED ORDER — SUGAMMADEX SODIUM 200 MG/2ML IV SOLN
INTRAVENOUS | Status: DC | PRN
  Administered 2018-11-12: 200 mg via INTRAVENOUS

## 2018-11-12 MED ORDER — FENTANYL CITRATE (PF) 100 MCG/2ML IJ SOLN
25.0000 ug | INTRAMUSCULAR | Status: DC | PRN
  Administered 2018-11-12 (×2): 50 ug via INTRAVENOUS

## 2018-11-12 MED ORDER — ONDANSETRON HCL 4 MG/2ML IJ SOLN
INTRAMUSCULAR | Status: AC
  Filled 2018-11-12: qty 2

## 2018-11-12 MED ORDER — ACETAMINOPHEN 500 MG PO TABS
ORAL_TABLET | ORAL | Status: AC
  Administered 2018-11-12: 1000 mg via ORAL
  Filled 2018-11-12: qty 2

## 2018-11-12 MED ORDER — SUGAMMADEX SODIUM 200 MG/2ML IV SOLN
INTRAVENOUS | Status: AC
  Filled 2018-11-12: qty 2

## 2018-11-12 MED ORDER — KETOROLAC TROMETHAMINE 30 MG/ML IJ SOLN
30.0000 mg | Freq: Once | INTRAMUSCULAR | Status: AC
  Administered 2018-11-12: 30 mg via INTRAVENOUS

## 2018-11-12 MED ORDER — METOCLOPRAMIDE HCL 5 MG/ML IJ SOLN
INTRAMUSCULAR | Status: AC
  Filled 2018-11-12: qty 2

## 2018-11-12 MED ORDER — HYDROMORPHONE HCL 1 MG/ML IJ SOLN
INTRAMUSCULAR | Status: DC | PRN
  Administered 2018-11-12: 0.5 mg via INTRAVENOUS

## 2018-11-12 MED ORDER — LIDOCAINE HCL (CARDIAC) PF 100 MG/5ML IV SOSY
PREFILLED_SYRINGE | INTRAVENOUS | Status: DC | PRN
  Administered 2018-11-12: 100 mg via INTRAVENOUS

## 2018-11-12 MED ORDER — METOCLOPRAMIDE HCL 5 MG/ML IJ SOLN: 10.0000 mg | Freq: Once | INTRAMUSCULAR | Status: DC | PRN

## 2018-11-12 MED ORDER — LACTATED RINGERS IV SOLN
INTRAVENOUS | Status: DC
  Administered 2018-11-12: 125 mL/h via INTRAVENOUS
  Administered 2018-11-12: 14:00:00 via INTRAVENOUS

## 2018-11-12 MED ORDER — METOCLOPRAMIDE HCL 5 MG/ML IJ SOLN
INTRAMUSCULAR | Status: DC | PRN
  Administered 2018-11-12: 10 mg via INTRAVENOUS

## 2018-11-12 MED ORDER — ONDANSETRON HCL 4 MG/2ML IJ SOLN
INTRAMUSCULAR | Status: DC | PRN
  Administered 2018-11-12: 4 mg via INTRAVENOUS

## 2018-11-12 MED ORDER — SCOPOLAMINE 1 MG/3DAYS TD PT72
1.0000 | MEDICATED_PATCH | Freq: Once | TRANSDERMAL | Status: DC
  Administered 2018-11-12: 1.5 mg via TRANSDERMAL

## 2018-11-12 MED ORDER — DEXAMETHASONE SODIUM PHOSPHATE 10 MG/ML IJ SOLN
INTRAMUSCULAR | Status: AC
  Filled 2018-11-12: qty 1

## 2018-11-12 MED ORDER — FENTANYL CITRATE (PF) 100 MCG/2ML IJ SOLN
INTRAMUSCULAR | Status: DC | PRN
  Administered 2018-11-12: 50 ug via INTRAVENOUS
  Administered 2018-11-12: 100 ug via INTRAVENOUS
  Administered 2018-11-12 (×2): 50 ug via INTRAVENOUS

## 2018-11-12 MED ORDER — SCOPOLAMINE 1 MG/3DAYS TD PT72
MEDICATED_PATCH | TRANSDERMAL | Status: AC
  Administered 2018-11-12: 1.5 mg via TRANSDERMAL
  Filled 2018-11-12: qty 1

## 2018-11-12 MED ORDER — ACETAMINOPHEN 500 MG PO TABS
ORAL_TABLET | ORAL | Status: AC
  Filled 2018-11-12: qty 2

## 2018-11-12 MED ORDER — FENTANYL CITRATE (PF) 250 MCG/5ML IJ SOLN
INTRAMUSCULAR | Status: AC
  Filled 2018-11-12: qty 5

## 2018-11-12 MED ORDER — ROCURONIUM BROMIDE 100 MG/10ML IV SOLN
INTRAVENOUS | Status: DC | PRN
  Administered 2018-11-12: 50 mg via INTRAVENOUS

## 2018-11-12 MED ORDER — TRIAMCINOLONE ACETONIDE 40 MG/ML IJ SUSP
40.0000 mg | Freq: Once | INTRAMUSCULAR | Status: AC
  Administered 2018-11-12: 40 mg via INTRAMUSCULAR
  Filled 2018-11-12: qty 1

## 2018-11-12 MED ORDER — KETOROLAC TROMETHAMINE 30 MG/ML IJ SOLN
INTRAMUSCULAR | Status: AC
  Administered 2018-11-12: 30 mg via INTRAVENOUS
  Filled 2018-11-12: qty 1

## 2018-11-12 MED ORDER — IBUPROFEN 800 MG PO TABS: ORAL_TABLET | ORAL | 2 refills | Status: DC

## 2018-11-12 MED ORDER — PROPOFOL 10 MG/ML IV BOLUS
INTRAVENOUS | Status: AC
  Filled 2018-11-12: qty 20

## 2018-11-12 MED ORDER — LACTATED RINGERS IV SOLN: INTRAVENOUS | Status: DC

## 2018-11-12 MED ORDER — FENTANYL CITRATE (PF) 100 MCG/2ML IJ SOLN
INTRAMUSCULAR | Status: AC
  Administered 2018-11-12: 50 ug via INTRAVENOUS
  Filled 2018-11-12: qty 2

## 2018-11-12 MED ORDER — BUPIVACAINE HCL (PF) 0.25 % IJ SOLN
INTRAMUSCULAR | Status: DC | PRN
  Administered 2018-11-12: 8 mL
  Administered 2018-11-12: 10 mL

## 2018-11-12 MED ORDER — MIDAZOLAM HCL 2 MG/2ML IJ SOLN
INTRAMUSCULAR | Status: DC | PRN
  Administered 2018-11-12: 2 mg via INTRAVENOUS

## 2018-11-12 SURGICAL SUPPLY — 31 items
CABLE HIGH FREQUENCY MONO STRZ (ELECTRODE) IMPLANT
DERMABOND ADVANCED (GAUZE/BANDAGES/DRESSINGS) ×1
DERMABOND ADVANCED .7 DNX12 (GAUZE/BANDAGES/DRESSINGS) ×1 IMPLANT
DILATOR CANAL MILEX (MISCELLANEOUS) IMPLANT
DRSG OPSITE POSTOP 3X4 (GAUZE/BANDAGES/DRESSINGS) ×2 IMPLANT
DURAPREP 26ML APPLICATOR (WOUND CARE) ×2 IMPLANT
GLOVE BIOGEL PI IND STRL 7.0 (GLOVE) ×3 IMPLANT
GLOVE BIOGEL PI INDICATOR 7.0 (GLOVE) ×3
GLOVE SURG SS PI 6.5 STRL IVOR (GLOVE) ×2 IMPLANT
GOWN STRL REUS W/TWL LRG LVL3 (GOWN DISPOSABLE) ×6 IMPLANT
HIBICLENS CHG 4% 4OZ BTL (MISCELLANEOUS) ×2 IMPLANT
NS IRRIG 1000ML POUR BTL (IV SOLUTION) ×2 IMPLANT
PACK LAPAROSCOPY BASIN (CUSTOM PROCEDURE TRAY) ×2 IMPLANT
PACK TRENDGUARD 450 HYBRID PRO (MISCELLANEOUS) ×1 IMPLANT
POUCH SPECIMEN RETRIEVAL 10MM (ENDOMECHANICALS) IMPLANT
PROTECTOR NERVE ULNAR (MISCELLANEOUS) ×2 IMPLANT
SET IRRIG TUBING LAPAROSCOPIC (IRRIGATION / IRRIGATOR) IMPLANT
SHEARS HARMONIC ACE PLUS 36CM (ENDOMECHANICALS) IMPLANT
SLEEVE XCEL OPT CAN 5 100 (ENDOMECHANICALS) IMPLANT
SUT MNCRL AB 4-0 PS2 18 (SUTURE) ×2 IMPLANT
SUT VICRYL 0 ENDOLOOP (SUTURE) IMPLANT
SUT VICRYL 0 UR6 27IN ABS (SUTURE) ×4 IMPLANT
SYR 50ML LL SCALE MARK (SYRINGE) ×2 IMPLANT
TOWEL OR 17X24 6PK STRL BLUE (TOWEL DISPOSABLE) ×4 IMPLANT
TRAY FOLEY W/BAG SLVR 14FR (SET/KITS/TRAYS/PACK) ×2 IMPLANT
TRENDGUARD 450 HYBRID PRO PACK (MISCELLANEOUS) ×2
TROCAR BALLN 12MMX100 BLUNT (TROCAR) ×2 IMPLANT
TROCAR OPTI TIP 5M 100M (ENDOMECHANICALS) ×2 IMPLANT
TROCAR XCEL DIL TIP R 11M (ENDOMECHANICALS) IMPLANT
TUBING INSUF HEATED (TUBING) ×2 IMPLANT
WARMER LAPAROSCOPE (MISCELLANEOUS) ×2 IMPLANT

## 2018-11-12 NOTE — Anesthesia Preprocedure Evaluation (Signed)
Anesthesia Evaluation  Patient identified by MRN, date of birth, ID band Patient awake    Reviewed: Allergy & Precautions, NPO status , Patient's Chart, lab work & pertinent test results  Airway Mallampati: II  TM Distance: >3 FB Neck ROM: Full    Dental no notable dental hx.    Pulmonary neg pulmonary ROS,    Pulmonary exam normal breath sounds clear to auscultation       Cardiovascular negative cardio ROS Normal cardiovascular exam Rhythm:Regular Rate:Normal     Neuro/Psych negative neurological ROS  negative psych ROS   GI/Hepatic negative GI ROS, Neg liver ROS,   Endo/Other  negative endocrine ROS  Renal/GU negative Renal ROS  negative genitourinary   Musculoskeletal negative musculoskeletal ROS (+)   Abdominal   Peds negative pediatric ROS (+)  Hematology negative hematology ROS (+)   Anesthesia Other Findings   Reproductive/Obstetrics negative OB ROS                             Anesthesia Physical Anesthesia Plan  ASA: II  Anesthesia Plan: General   Post-op Pain Management:    Induction: Intravenous  PONV Risk Score and Plan: 4 or greater and Ondansetron, Dexamethasone, Midazolam and Scopolamine patch - Pre-op  Airway Management Planned: Oral ETT  Additional Equipment:   Intra-op Plan:   Post-operative Plan: Extubation in OR  Informed Consent: I have reviewed the patients History and Physical, chart, labs and discussed the procedure including the risks, benefits and alternatives for the proposed anesthesia with the patient or authorized representative who has indicated his/her understanding and acceptance.   Dental advisory given  Plan Discussed with: CRNA  Anesthesia Plan Comments:         Anesthesia Quick Evaluation

## 2018-11-12 NOTE — Op Note (Signed)
Diagnostic Laparoscopy Procedure Note  Indications: The patient is a 27 y.o. female with increasingly severe dysmenorrhea and intermentstrual pelvic pain over the last 4 months..  Pre-operative Diagnosis: Pelvic pain. uterine fibroid  Post-operative Diagnosis: Pelvic pain.  Uterine fibroid. r/o endometriosis  Surgeon: Eldred Manges   Assistants: none  Anesthesia: General endotracheal anesthesia  ASA Class: 3  Procedure Details  The patient was seen in the Holding Room. The risks, benefits, complications, treatment options, and expected outcomes were discussed with the patient. The possibilities of reaction to medication, pulmonary aspiration, perforation of viscus, bleeding, recurrent infection, the need for additional procedures, failure to diagnose a condition, and creating a complication requiring transfusion or operation were discussed with the patient. The patient concurred with the proposed plan, giving informed consent. The patient was taken to the Operating Room, identified as Paige Stout and the procedure verified as Diagnostic Laparoscopy. A Time Out was held and the above information confirmed.  After induction of general anesthesia, the patient was placed in modified dorsal lithotomy position where she was prepped, draped, and catheterized in the normal, sterile fashion.  The abdomen was prepped with ChloraPrep.  The perineum and vagina were prepped with Hibiclens.  A Foley was placed transurethrally under sterile conditions after cleansing the urethra with Betadine, and connected to straight drainage.  Prior to the vaginal prep a sample was obtained from the cervix for gonorrhea and Chlamydia cultures.  Once the vaginal prep had been completed a Hulka tenaculum was placed on the anterior cervix.    . A 1.5 cm umbilical incision was then performed.  A combination of blunt and sharp dissection allowed the fascia to be grasped and incised.  The posterior fascia was likewise  incised and the peritoneum entered bluntly.  Holding sutures were placed on the fascia on either side and a Hassan cannula placed into the peritoneal cavity under direct visualization.  The holding sutures were secured to be Hassan cannula.  The laparoscope was placed through the [facility] trocar sleeve. The above findings were noted.   The filmy erythematous area on the right posterior mesosalpinx at the level of the utero-ovarian ligament was then biopsied with minimal tissue obtained.  That tissue was removed from the operative field.  A second area of minute petechial lesion on the right anterior cul-de-sac was likewise biopsied and the minute piece of tissue removed from the operative field.  No other areas that could be consistent with endometriosis were noted.  Approximately 10 cc of quarter percent Marcaine was left in the peritoneal cavity.  Following the procedure the umbilical sheath was removed after intra-abdominal carbon dioxide was expressed.  The sutures which should help the [facility] cannula in place were then used to create 2 figure-of-eight sutures in the fascia for incisional closure.  The suture used was 0 Vicryl.  The skin incision was closed with  subcuticular sutures of 4-0 Vicryl.  Dermabond and a sterile dressing were applied to the abdominal incision after a mixture of Kenalog 40 mg plus quarter percent Marcaine 10 cc was injected into the incision.    The intrauterine manipulator and Foley catheter were then removed.  Instrument, sponge, and needle counts were correct prior to abdominal closure and at the conclusion of the case.   Findings: The anterior cul-de-sac and round ligaments the right anterior cul-de-sac contained a minute petechial lesion which was subsequently biopsied. The uterus was upper limits of normal size with a 3 cm anterior subserosal myoma near the lower uterine  segment. The adnexa the right and left ovaries were within normal limits without adhesions or  stigmata of endometriosis.  The right and left fallopian tube were within normal limits, both containing tiny paratubal cyst.  Posterior to the right fallopian tube near the uterotubal junction and the utero ovarian ligament there was a filmy erythematous lesion which was subsequently biopsied. Cul-de-sac no adhesions or lesions consistent with endometriosis Appendix: No lesions or adhesions were noted it was freely mobile.  Estimated Blood Loss:  less than 50 mL         Drains: None         Specimens: 1)Anterior cul-de-sac biopsy                            2) right utero-ovarian ligament biopsy Complications:  None; patient tolerated the procedure well.         Disposition: PACU - hemodynamically stable.         Condition: stable  No pharmacologic VTE prophylaxis was employed due to risk of bleeding and anticipated early ambulation.  SCD hose were used throughout the procedure.

## 2018-11-12 NOTE — Anesthesia Procedure Notes (Addendum)
Procedure Name: Intubation Date/Time: 11/12/2018 1:33 PM Performed by: Flossie Dibble, CRNA Pre-anesthesia Checklist: Patient identified, Patient being monitored, Timeout performed, Emergency Drugs available and Suction available Patient Re-evaluated:Patient Re-evaluated prior to induction Oxygen Delivery Method: Circle System Utilized Preoxygenation: Pre-oxygenation with 100% oxygen Induction Type: IV induction Ventilation: Mask ventilation without difficulty Laryngoscope Size: Mac and 3 (cricoid pressure used to facilitate view of cords.) Grade View: Grade I Tube type: Oral Tube size: 7.0 mm Number of attempts: 1 Airway Equipment and Method: stylet Placement Confirmation: ETT inserted through vocal cords under direct vision,  positive ETCO2 and breath sounds checked- equal and bilateral Secured at: 21 cm Tube secured with: Tape Dental Injury: Teeth and Oropharynx as per pre-operative assessment

## 2018-11-12 NOTE — Discharge Instructions (Signed)
Post Anesthesia Home Care Instructions  Activity: Get plenty of rest for the remainder of the day. A responsible individual must stay with you for 24 hours following the procedure.  For the next 24 hours, DO NOT: -Drive a car -Paediatric nurse -Drink alcoholic beverages -Take any medication unless instructed by your physician -Make any legal decisions or sign important papers.  Meals: Start with liquid foods such as gelatin or soup. Progress to regular foods as tolerated. Avoid greasy, spicy, heavy foods. If nausea and/or vomiting occur, drink only clear liquids until the nausea and/or vomiting subsides. Call your physician if vomiting continues.  Special Instructions/Symptoms: Your throat may feel dry or sore from the anesthesia or the breathing tube placed in your throat during surgery. If this causes discomfort, gargle with warm salt water. The discomfort should disappear within 24 hours.  If you had a scopolamine patch placed behind your ear for the management of post- operative nausea and/or vomiting:  1. The medication in the patch is effective for 72 hours, after which it should be removed.  Wrap patch in a tissue and discard in the trash. Wash hands thoroughly with soap and water. 2. You may remove the patch earlier than 72 hours if you experience unpleasant side effects which may include dry mouth, dizziness or visual disturbances. 3. Avoid touching the patch. Wash your hands with soap and water after contact with the patch.   laparosc  Diagnostic Laparoscopy, Care After Refer to this sheet in the next few weeks. These instructions provide you with information about caring for yourself after your procedure. Your health care provider may also give you more specific instructions. Your treatment has been planned according to current medical practices, but problems sometimes occur. Call your health care provider if you have any problems or questions after your procedure. What can I  expect after the procedure? After your procedure, it is common to have mild discomfort in the throat and abdomen. Follow these instructions at home:  Take over-the-counter and prescription medicines only as told by your health care provider.  Do not drive for 24 hours if you received a sedative.  Return to your normal activities as told by your health care provider.  Do not take baths, swim, or use a hot tub until your health care provider approves. You may shower.  Follow instructions from your health care provider about how to take care of your incision. Make sure you: ? Wash your hands with soap and water before you change your bandage (dressing). If soap and water are not available, use hand sanitizer. ? Change your dressing as told by your health care provider. ? Leave stitches (sutures), skin glue, or adhesive strips in place. These skin closures may need to stay in place for 2 weeks or longer. If adhesive strip edges start to loosen and curl up, you may trim the loose edges. Do not remove adhesive strips completely unless your health care provider tells you to do that.  Check your incision area every day for signs of infection. Check for: ? More redness, swelling, or pain. ? More fluid or blood. ? Warmth. ? Pus or a bad smell.  It is your responsibility to get the results of your procedure. Ask your health care provider or the department performing the procedure when your results will be ready. Contact a health care provider if:  There is new pain in your shoulders.  You feel light-headed or faint.  You are unable to pass gas or unable  to have a bowel movement.  You feel nauseous or you vomit.  You develop a rash.  You have more redness, swelling, or pain around your incision.  You have more fluid or blood coming from your incision.  Your incision feels warm to the touch.  You have pus or a bad smell coming from your incision.  You have a fever or chills. Get help  right away if:  Your pain is getting worse.  You have ongoing vomiting.  The edges of your incision open up.  You have trouble breathing.  You have chest pain. This information is not intended to replace advice given to you by your health care provider. Make sure you discuss any questions you have with your health care provider. Document Released: 10/26/2015 Document Revised: 04/21/2016 Document Reviewed: 07/28/2015 Elsevier Interactive Patient Education  2018 [employer].

## 2018-11-12 NOTE — Telephone Encounter (Signed)
preop telephoone call documented in office chart

## 2018-11-12 NOTE — Transfer of Care (Signed)
Immediate Anesthesia Transfer of Care Note  Patient: Paige Stout  Procedure(s) Performed: LAPAROSCOPY OPERATIVE, Peritoneal Biopsies (N/A Abdomen)  Patient Location: PACU  Anesthesia Type:General  Level of Consciousness: awake, alert  and oriented  Airway & Oxygen Therapy: Patient Spontanous Breathing and Patient connected to nasal cannula oxygen  Post-op Assessment: Report given to RN and Post -op Vital signs reviewed and stable  Post vital signs: Reviewed and stable  Last Vitals:  Vitals Value Taken Time  BP    Temp    Pulse 86 11/12/2018  3:23 PM  Resp    SpO2 100 % 11/12/2018  3:23 PM  Vitals shown include unvalidated device data.  Last Pain:  Vitals:   11/12/18 1123  TempSrc: Oral  PainSc: 4       Patients Stated Pain Goal: 5 (61/84/85 9276)  Complications: No apparent anesthesia complications

## 2018-11-13 LAB — CERVICOVAGINAL ANCILLARY ONLY
Chlamydia: NEGATIVE
Neisseria Gonorrhea: NEGATIVE

## 2018-11-13 NOTE — Anesthesia Postprocedure Evaluation (Signed)
Anesthesia Post Note  Patient: [place]  Procedure(s) Performed: LAPAROSCOPY OPERATIVE, Peritoneal Biopsies (N/A Abdomen)     Patient location during evaluation: PACU Anesthesia Type: General Level of consciousness: awake and alert Pain management: pain level controlled Vital Signs Assessment: post-procedure vital signs reviewed and stable Respiratory status: spontaneous breathing, nonlabored ventilation, respiratory function stable and patient connected to nasal cannula oxygen Cardiovascular status: blood pressure returned to baseline and stable Postop Assessment: no apparent nausea or vomiting Anesthetic complications: no    Last Vitals:  Vitals:   11/12/18 1700 11/12/18 1730  BP: 105/80 116/64  Pulse: 98 87  Resp: 12 16  Temp: 36.8 C 36.7 C  SpO2: 94% 98%    Last Pain:  Vitals:   11/12/18 1800  TempSrc:   PainSc: 0-No pain                 Montez Hageman

## 2018-11-14 ENCOUNTER — Encounter (HOSPITAL_COMMUNITY): Payer: Self-pay | Admitting: Obstetrics and Gynecology

## 2018-11-14 NOTE — Addendum Note (Signed)
Addendum  created 11/14/18 1449 by Asher Muir, CRNA   Charge Capture section accepted

## 2018-11-19 DIAGNOSIS — R109 Unspecified abdominal pain: Secondary | ICD-10-CM | POA: Diagnosis not present

## 2018-11-19 DIAGNOSIS — M545 Low back pain: Secondary | ICD-10-CM | POA: Diagnosis not present

## 2018-11-19 DIAGNOSIS — R102 Pelvic and perineal pain: Secondary | ICD-10-CM | POA: Diagnosis not present

## 2018-11-20 NOTE — Addendum Note (Signed)
Addendum  created 11/20/18 1139 by Lucretia Kern D, CRNA   Charge Capture section accepted

## 2018-11-26 DIAGNOSIS — L910 Hypertrophic scar: Secondary | ICD-10-CM | POA: Diagnosis not present

## 2018-11-26 DIAGNOSIS — N803 Endometriosis of pelvic peritoneum: Secondary | ICD-10-CM | POA: Diagnosis not present

## 2018-11-26 DIAGNOSIS — Z319 Encounter for procreative management, unspecified: Secondary | ICD-10-CM | POA: Diagnosis not present

## 2018-12-07 DIAGNOSIS — N979 Female infertility, unspecified: Secondary | ICD-10-CM | POA: Diagnosis not present

## 2018-12-11 ENCOUNTER — Ambulatory Visit (HOSPITAL_COMMUNITY)
Admission: EM | Admit: 2018-12-11 | Discharge: 2018-12-11 | Disposition: A | Discharge status: Home / Self Care | Payer: 59

## 2018-12-11 DIAGNOSIS — N920 Excessive and frequent menstruation with regular cycle: Secondary | ICD-10-CM | POA: Diagnosis not present

## 2018-12-11 NOTE — ED Notes (Signed)
Paige Stout, patient access reports patient was called by ob/gyn office and going there now-has left the building

## 2018-12-19 DIAGNOSIS — Z319 Encounter for procreative management, unspecified: Secondary | ICD-10-CM | POA: Diagnosis not present

## 2018-12-19 DIAGNOSIS — N970 Female infertility associated with anovulation: Secondary | ICD-10-CM | POA: Diagnosis not present

## 2018-12-19 DIAGNOSIS — N803 Endometriosis of pelvic peritoneum: Secondary | ICD-10-CM | POA: Diagnosis not present

## 2019-01-02 ENCOUNTER — Ambulatory Visit: Payer: 59 | Admitting: Family Medicine

## 2019-01-02 NOTE — Progress Notes (Deleted)
Paige Stout is a 28 y.o. female is here for follow up.  History of Present Illness:   (SCRIBE ATTESTATION)  HPI:   There are no preventive care reminders to display for this patient. Depression screen [facility] 2/9 10/04/2018 08/24/2017  Decreased Interest 0 0  Down, Depressed, Hopeless 0 0  PHQ - 2 Score 0 0   PMHx, SurgHx, SocialHx, FamHx, Medications, and Allergies were reviewed in the Visit Navigator and updated as appropriate.   Patient Active Problem List   Diagnosis Date Noted  . Menorrhagia 10/04/2018  . At risk for infertility 10/04/2018  . Obesity (BMI 30.0-34.9) 10/04/2018  . PCOS (polycystic ovarian syndrome) suspected 10/04/2018  . Pelvic pain 09/29/2018  . Uterine leiomyoma 09/29/2018  . Dysmenorrhea 09/19/2018   Social History   Tobacco Use  . Smoking status: Never Smoker  . Smokeless tobacco: Never Used  Substance Use Topics  . Alcohol use: No  . Drug use: No   Current Medications and Allergies   Current Outpatient Medications:  .  ibuprofen (ADVIL,MOTRIN) 800 MG tablet, Take 1 tablet by mouth every 8 hours for 3 days and then a every 8 hours as needed for pain, Disp: 40 tablet, Rfl: 2 .  metFORMIN (GLUCOPHAGE XR) 750 MG 24 hr tablet, Take 1 tablet (750 mg total) by mouth 2 (two) times daily., Disp: 180 tablet, Rfl: 3 .  oxycodone (OXY-IR) 5 MG capsule, 1-2 tablets every 4 hours as needed for severe pain, Disp: 30 capsule, Rfl: 0 .  phentermine (ADIPEX-P) 37.5 MG tablet, Take 1 tablet (37.5 mg total) by mouth daily before breakfast., Disp: 30 tablet, Rfl: 0 .  Prenatal Vit-Fe Fumarate-FA (PRENATAL VITAMIN PLUS LOW IRON PO), Take 1 tablet by mouth daily., Disp: , Rfl:   No Known Allergies Review of Systems   Pertinent items are noted in the HPI. Otherwise, a complete ROS is negative.  Vitals  There were no vitals filed for this visit.   There is no height or weight on file to calculate BMI.  Physical Exam   Physical Exam  Results for orders  placed or performed during the hospital encounter of 11/12/18  CBC  Result Value Ref Range   WBC 10.5 4.0 - 10.5 K/uL   RBC 4.48 3.87 - 5.11 MIL/uL   Hemoglobin 13.3 12.0 - 15.0 g/dL   HCT 38.9 36.0 - 46.0 %   MCV 86.8 80.0 - 100.0 fL   MCH 29.7 26.0 - 34.0 pg   MCHC 34.2 30.0 - 36.0 g/dL   RDW 13.1 11.5 - 15.5 %   Platelets 392 150 - 400 K/uL   nRBC 0.0 0.0 - 0.2 %  Pregnancy, urine  Result Value Ref Range   Preg Test, Ur NEGATIVE NEGATIVE  Cervicovaginal ancillary only  Result Value Ref Range   Chlamydia Negative    Neisseria gonorrhea Negative     Assessment and Plan   There are no diagnoses linked to this encounter.  . Orders and follow up as documented in [facility], reviewed diet, exercise and weight control, cardiovascular risk and specific lipid/LDL goals reviewed, reviewed medications and side effects in detail.  . Reviewed expectations re: course of current medical issues. . Outlined signs and symptoms indicating need for more acute intervention. . Patient verbalized understanding and all questions were answered. . Patient received an After Visit Summary.  *** CMA served as Education administrator during this visit. History, Physical, and Plan performed by medical provider. The above documentation has been reviewed and is  accurate and complete. Briscoe Deutscher, D.O.  Briscoe Deutscher, DO [place], [place] 01/02/2019

## 2019-03-14 DIAGNOSIS — H04123 Dry eye syndrome of bilateral lacrimal glands: Secondary | ICD-10-CM | POA: Diagnosis not present

## 2019-05-22 ENCOUNTER — Other Ambulatory Visit: Payer: Self-pay | Admitting: Internal Medicine

## 2019-05-22 DIAGNOSIS — Z20822 Contact with and (suspected) exposure to covid-19: Secondary | ICD-10-CM

## 2019-05-25 LAB — NOVEL CORONAVIRUS, NAA: SARS-CoV-2, NAA: NOT DETECTED

## 2019-06-21 ENCOUNTER — Ambulatory Visit: Payer: 59 | Admitting: Family Medicine

## 2019-07-23 ENCOUNTER — Encounter: Payer: 59 | Admitting: Family Medicine

## 2019-08-06 NOTE — Progress Notes (Signed)
Subjective:    Paige Stout is a 28 y.o. female and is here for a comprehensive physical exam.  Current Outpatient Medications:  .  Prenatal Vit-Fe Fumarate-FA (PRENATAL VITAMIN PLUS LOW IRON PO), Take 1 tablet by mouth daily., Disp: , Rfl:  .  Cholecalciferol 1.25 MG (50000 UT) TABS, 50,000 units PO qwk for 12 weeks., Disp: 12 tablet, Rfl: 0  There are no preventive care reminders to display for this patient.  PMHx, SurgHx, SocialHx, Medications, and Allergies were reviewed in the Visit Navigator and updated as appropriate.   Past Medical History:  Diagnosis Date  . Dysmenorrhea 09/19/2018  . Menorrhagia 10/04/2018  . PCOS (polycystic ovarian syndrome)   . Shingles 2013  . Uterine leiomyoma 09/29/2018     Past Surgical History:  Procedure Laterality Date  . LAPAROSCOPY N/A 11/12/2018   Procedure: LAPAROSCOPY OPERATIVE, Peritoneal Biopsies;  Surgeon: Eldred Manges, MD;  Location: [facility];  Service: Gynecology;  Laterality: N/A;     Family History  Problem Relation Age of Onset  . Breast cancer Maternal Aunt   . Breast cancer Paternal Aunt   . Kidney cancer Father   . Diabetes Father   . Hearing loss Father   . Hypertension Father    Social History   Tobacco Use  . Smoking status: Never Smoker  . Smokeless tobacco: Never Used  Substance Use Topics  . Alcohol use: No  . Drug use: No   Review of Systems:   Pertinent items are noted in the HPI. Otherwise, ROS is negative.  Objective:   BP 120/78   Pulse 87   Temp (!) 97.3 F (36.3 C) (Temporal)   Ht 5\' 7"  (1.702 m)   Wt 193 lb 3.2 oz (87.6 kg)   LMP 08/02/2019   SpO2 98%   BMI 30.26 kg/m    General appearance: alert, cooperative and appears stated age. Head: normocephalic, without obvious abnormality, atraumatic. Neck: no adenopathy, supple, symmetrical, trachea midline; thyroid not enlarged, symmetric, no tenderness/mass/nodules. Lungs: clear to auscultation bilaterally. Heart: regular rate  and rhythm. Abdomen: soft, non-tender; no masses,  no organomegaly. Extremities: extremities normal, atraumatic, no cyanosis or edema. Skin: skin color, texture, turgor normal, no rashes or lesions. Lymph: cervical, supraclavicular, and axillary nodes normal; no abnormal inguinal nodes palpated. Neurologic: grossly normal.   Assessment/Plan:   Paige Stout was seen today for annual exam.  Diagnoses and all orders for this visit:  Routine physical examination  PCOS (polycystic ovarian syndrome) suspected  Obesity (BMI 30.0-34.9)  At risk for infertility  Dysmenorrhea -     CBC with Differential/Platelet -     Comprehensive metabolic panel -     Magnesium -     TSH  Screening for lipid disorders -     Lipid panel  Hyperglycemia -     Hemoglobin A1c  Vitamin D deficiency -     VITAMIN D 25 Hydroxy (Vit-D Deficiency, Fractures) -     Cholecalciferol 1.25 MG (50000 UT) TABS; 50,000 units PO qwk for 12 weeks.  B12 deficiency -     Vitamin B12  Primary insomnia   Patient Counseling:   [x]     Nutrition: Stressed importance of moderation in sodium/caffeine intake, saturated fat and cholesterol, caloric balance, sufficient intake of fresh fruits, vegetables, fiber, calcium, iron, and 1 mg of folate supplement per day (for females capable of pregnancy).   [x]      Stressed the importance of regular exercise.    [x]   Substance Abuse: Discussed cessation/primary prevention of tobacco, alcohol, or other drug use; driving or other dangerous activities under the influence; availability of treatment for abuse.    [x]      Injury prevention: Discussed safety belts, safety helmets, smoke detector, smoking near bedding or upholstery.    [x]      Sexuality: Discussed sexually transmitted diseases, partner selection, use of condoms, avoidance of unintended pregnancy  and contraceptive alternatives.    [x]     Dental health: Discussed importance of regular tooth brushing, flossing,  and dental visits.   [x]      Health maintenance and immunizations reviewed. Please refer to Health maintenance section.   Briscoe Deutscher, DO [place]

## 2019-08-07 ENCOUNTER — Other Ambulatory Visit: Payer: Self-pay

## 2019-08-07 ENCOUNTER — Ambulatory Visit (INDEPENDENT_AMBULATORY_CARE_PROVIDER_SITE_OTHER): Payer: 59 | Admitting: Family Medicine

## 2019-08-07 ENCOUNTER — Encounter: Payer: Self-pay | Admitting: Family Medicine

## 2019-08-07 VITALS — BP 120/78 | HR 87 | Temp 97.3°F | Ht 67.0 in | Wt 193.2 lb

## 2019-08-07 DIAGNOSIS — Z13220 Encounter for screening for lipoid disorders: Secondary | ICD-10-CM

## 2019-08-07 DIAGNOSIS — N946 Dysmenorrhea, unspecified: Secondary | ICD-10-CM | POA: Diagnosis not present

## 2019-08-07 DIAGNOSIS — Z0000 Encounter for general adult medical examination without abnormal findings: Secondary | ICD-10-CM

## 2019-08-07 DIAGNOSIS — Z9189 Other specified personal risk factors, not elsewhere classified: Secondary | ICD-10-CM | POA: Diagnosis not present

## 2019-08-07 DIAGNOSIS — E282 Polycystic ovarian syndrome: Secondary | ICD-10-CM | POA: Diagnosis not present

## 2019-08-07 DIAGNOSIS — E66811 Obesity, class 1: Secondary | ICD-10-CM

## 2019-08-07 DIAGNOSIS — E669 Obesity, unspecified: Secondary | ICD-10-CM | POA: Diagnosis not present

## 2019-08-07 DIAGNOSIS — R739 Hyperglycemia, unspecified: Secondary | ICD-10-CM | POA: Diagnosis not present

## 2019-08-07 DIAGNOSIS — E559 Vitamin D deficiency, unspecified: Secondary | ICD-10-CM

## 2019-08-07 DIAGNOSIS — F5101 Primary insomnia: Secondary | ICD-10-CM

## 2019-08-07 DIAGNOSIS — E538 Deficiency of other specified B group vitamins: Secondary | ICD-10-CM

## 2019-08-07 LAB — CBC WITH DIFFERENTIAL/PLATELET
Basophils Absolute: 0.1 10*3/uL (ref 0.0–0.1)
Basophils Relative: 0.7 % (ref 0.0–3.0)
Eosinophils Absolute: 0.2 10*3/uL (ref 0.0–0.7)
Eosinophils Relative: 1.9 % (ref 0.0–5.0)
HCT: 37.8 % (ref 36.0–46.0)
Hemoglobin: 12.6 g/dL (ref 12.0–15.0)
Lymphocytes Relative: 33.4 % (ref 12.0–46.0)
Lymphs Abs: 2.7 10*3/uL (ref 0.7–4.0)
MCHC: 33.4 g/dL (ref 30.0–36.0)
MCV: 87.9 fl (ref 78.0–100.0)
Monocytes Absolute: 0.4 10*3/uL (ref 0.1–1.0)
Monocytes Relative: 5.1 % (ref 3.0–12.0)
Neutro Abs: 4.8 10*3/uL (ref 1.4–7.7)
Neutrophils Relative %: 58.9 % (ref 43.0–77.0)
Platelets: 380 10*3/uL (ref 150.0–400.0)
RBC: 4.3 Mil/uL (ref 3.87–5.11)
RDW: 13.1 % (ref 11.5–15.5)
WBC: 8.2 10*3/uL (ref 4.0–10.5)

## 2019-08-07 LAB — LIPID PANEL
Cholesterol: 139 mg/dL (ref 0–200)
HDL: 53.6 mg/dL (ref >39.00)
LDL Cholesterol: 74 mg/dL (ref 0–99)
NonHDL: 85.33
Total CHOL/HDL Ratio: 3
Triglycerides: 57 mg/dL (ref 0.0–149.0)
VLDL: 11.4 mg/dL (ref 0.0–40.0)

## 2019-08-07 LAB — COMPREHENSIVE METABOLIC PANEL
ALT: 11 U/L (ref 0–35)
AST: 15 U/L (ref 0–37)
Albumin: 4 g/dL (ref 3.5–5.2)
Alkaline Phosphatase: 56 U/L (ref 39–117)
BUN: 9 mg/dL (ref 6–23)
CO2: 29 mEq/L (ref 19–32)
Calcium: 9.6 mg/dL (ref 8.4–10.5)
Chloride: 103 mEq/L (ref 96–112)
Creatinine, Ser: 0.61 mg/dL (ref 0.40–1.20)
GFR: 138.79 mL/min (ref >60.00)
Glucose, Bld: 90 mg/dL (ref 70–99)
Potassium: 4.1 mEq/L (ref 3.5–5.1)
Sodium: 138 mEq/L (ref 135–145)
Total Bilirubin: 0.4 mg/dL (ref 0.2–1.2)
Total Protein: 7.3 g/dL (ref 6.0–8.3)

## 2019-08-07 LAB — HEMOGLOBIN A1C: Hgb A1c MFr Bld: 5.6 % (ref 4.6–6.5)

## 2019-08-07 LAB — MAGNESIUM: Magnesium: 2.1 mg/dL (ref 1.5–2.5)

## 2019-08-07 LAB — VITAMIN D 25 HYDROXY (VIT D DEFICIENCY, FRACTURES): VITD: 8.73 ng/mL — ABNORMAL LOW (ref 30.00–100.00)

## 2019-08-07 LAB — VITAMIN B12: Vitamin B-12: 453 pg/mL (ref 211–911)

## 2019-08-07 LAB — TSH: TSH: 1.03 u[IU]/mL (ref 0.35–4.50)

## 2019-08-07 MED ORDER — CHOLECALCIFEROL 1.25 MG (50000 UT) PO TABS: ORAL_TABLET | ORAL | 0 refills | Status: DC

## 2019-08-07 NOTE — Patient Instructions (Addendum)
15 Tips to a Better Night's Sleep   Practice "good sleep hygiene". Here are some tips for you to try:   1. No reading or watching TV in bed. These are waking activities.  2. Go to bed when you're sleepy-tired, not when it's time to go to bed by habit.  3. Wind down during the second half of the evening before bedtime. Don't get involved in any kind of anxiety-provoking activities of thought 90 minutes before retiring to bed.  4. Do some breathing exercises or try to relax major muscle groups. Start at the toes and work up the body all the way to the forehead.  5. Your bed is for sleeping, so if you cannot sleep after 15-20 minutes in bed, get up and do something relaxing.  6. Have your room cool instead of warm.  7. Don't count sheep-counting is stimulating.  8. Exercise in the afternoon or early evening, but no later than three hours before bedtime.  9. Don't overeat or eat two to three hours before bedtime.  10. Try not to nap during the day.  11. If you awake in the middle of the night and can't get back to sleep within 30 minutes, get up and do something relaxing (no TV or reading anything stimulating).  12. Have no caffeine, alcohol or cigarettes two to three hours before retiring to bed.  13. If you have disturbing dreams or nightmares repeatedly, try to add an ending you enjoy or like better.  69. Keep a sleep journal. Thirty minutes before you go to bed, write down your concerns and hopes. It frees up your sleep from processing your dilemmas.  15. Listen to calming music or recorded sounds (ocean, forest, birds, crickets, brook) before bed.   If sleep problems persist, contact your physician or mental health professional. Let them know what is happening in your life. Your problem may have either organic or psychological contributors. Sleep disorders are considered chronic if they persist over more than one month.     We talked about: Melatonin 3 mg at night. Magnesium 400 mg at  night. Psyllium Husk daily

## 2019-08-10 DIAGNOSIS — F5101 Primary insomnia: Secondary | ICD-10-CM | POA: Insufficient documentation

## 2019-11-18 ENCOUNTER — Other Ambulatory Visit: Payer: Self-pay | Admitting: Obstetrics and Gynecology

## 2019-11-22 ENCOUNTER — Other Ambulatory Visit (HOSPITAL_COMMUNITY)
Admission: RE | Admit: 2019-11-22 | Discharge: 2019-11-22 | Disposition: A | Discharge status: Home / Self Care | Payer: 59 | Source: Ambulatory Visit | Attending: Obstetrics and Gynecology | Admitting: Obstetrics and Gynecology

## 2019-11-22 DIAGNOSIS — Z3149 Encounter for other procreative investigation and testing: Secondary | ICD-10-CM | POA: Insufficient documentation

## 2019-11-22 LAB — TSH: TSH: 2.425 u[IU]/mL (ref 0.350–4.500)

## 2019-11-28 LAB — ANTI MULLERIAN HORMONE: ANTI-MULLERIAN HORMONE (AMH): 5.88 ng/mL

## 2019-12-11 LAB — PROLACTIN

## 2019-12-11 LAB — PROGESTERONE

## 2020-04-14 ENCOUNTER — Ambulatory Visit (INDEPENDENT_AMBULATORY_CARE_PROVIDER_SITE_OTHER): Payer: 59 | Admitting: Family Medicine

## 2020-04-14 ENCOUNTER — Encounter (INDEPENDENT_AMBULATORY_CARE_PROVIDER_SITE_OTHER): Payer: Self-pay | Admitting: Family Medicine

## 2020-04-14 ENCOUNTER — Other Ambulatory Visit: Payer: Self-pay

## 2020-04-14 VITALS — BP 109/75 | HR 79 | Temp 98.3°F | Ht 66.0 in | Wt 199.0 lb

## 2020-04-14 DIAGNOSIS — Z9189 Other specified personal risk factors, not elsewhere classified: Secondary | ICD-10-CM

## 2020-04-14 DIAGNOSIS — Z6832 Body mass index (BMI) 32.0-32.9, adult: Secondary | ICD-10-CM

## 2020-04-14 DIAGNOSIS — F3289 Other specified depressive episodes: Secondary | ICD-10-CM

## 2020-04-14 DIAGNOSIS — E559 Vitamin D deficiency, unspecified: Secondary | ICD-10-CM | POA: Diagnosis not present

## 2020-04-14 DIAGNOSIS — Z8742 Personal history of other diseases of the female genital tract: Secondary | ICD-10-CM

## 2020-04-14 DIAGNOSIS — E538 Deficiency of other specified B group vitamins: Secondary | ICD-10-CM

## 2020-04-14 DIAGNOSIS — E669 Obesity, unspecified: Secondary | ICD-10-CM

## 2020-04-14 DIAGNOSIS — Z0289 Encounter for other administrative examinations: Secondary | ICD-10-CM

## 2020-04-14 DIAGNOSIS — R5383 Other fatigue: Secondary | ICD-10-CM

## 2020-04-14 NOTE — Progress Notes (Signed)
Office: [phone]  /  Fax: [phone]    Date: Apr 15, 2020   Appointment Start Time: 11:36am Duration: 34 minutes Provider: Glennie Stout, Psy.D. Type of Session: Intake for Individual Therapy  Location of Patient: Home Location of Provider: Provider's Home Type of Contact: Telepsychological Visit via MyChart Video Visit  Informed Consent: Prior to proceeding with today's appointment, two pieces of identifying information were obtained. In addition, Paige Stout's physical location at the time of this appointment was obtained as well a phone number she could be reached at in the event of technical difficulties. Paige Stout and this provider participated in today's telepsychological service.   The provider's role was explained to Paige Stout. The provider reviewed and discussed issues of confidentiality, privacy, and limits therein (e.g., reporting obligations). In addition to verbal informed consent, written informed consent for psychological services was obtained prior to the initial appointment. Since the clinic is not a 24/7 crisis center, mental health emergency resources were shared and this  provider explained MyChart, e-mail, voicemail, and/or other messaging systems should be utilized only for non-emergency reasons. This provider also explained that information obtained during appointments will be placed in Paige Stout's medical record and relevant information will be shared with other providers at [facility] for coordination of care. Moreover, Paige Stout agreed information may be shared with other [facility] providers as needed for coordination of care. By signing the service agreement document, Paige Stout provided written consent for coordination of care. Prior to initiating telepsychological services, Paige Stout completed an informed consent document, which included the development of a safety plan (i.e., an emergency contact, nearest emergency room, and emergency  resources) in the event of an emergency/crisis. Paige Stout expressed understanding of the rationale of the safety plan. Paige Stout verbally acknowledged understanding she is ultimately responsible for understanding her insurance benefits for telepsychological and in-person services. This provider also reviewed confidentiality, as it relates to telepsychological services, as well as the rationale for telepsychological services (i.e., to reduce exposure risk to COVID-19). Paige Stout  acknowledged understanding that appointments cannot be recorded without both party consent and she is aware she is responsible for securing confidentiality on her end of the session. Paige Stout verbally consented to proceed.  Chief Complaint/HPI: Paige Stout was referred by Paige Stout due to other depression, with emotional eating. Per the note for the initial visit with Paige Stout on Apr 14, 2020, "Paige Stout has a history of emotional eating, and will eat when she is stressed, sad, upset, or bored. Her PHQ-9 score is 6." The note for the initial appointment with Paige Stout indicated the following: "Paige Stout's habits were reviewed today and are as follows: Her family eats meals together, she thinks her family will eat healthier with her, her desired weight loss is 29-34 lbs, she has been heavy most of her life, she started gaining weight in 2017, her heaviest weight ever was 222 pounds, she has significant food cravings issues, she snacks frequently in the evenings, she skips meals frequently, she is frequently drinking liquids with calories, she frequently makes poor food choices, she frequently eats larger portions than normal and she struggles with emotional eating." Paige Stout's Food and Mood (modified PHQ-9) score on Apr 14, 2020 was 6.  During today's appointment, Paige Stout was verbally administered a questionnaire assessing various behaviors related to emotional eating. Paige Stout endorsed the following: overeat when you are celebrating,  experience food cravings on a regular basis, use food to help you cope with emotional situations, find food is comforting to  you, overeat when you are worried about something, overeat frequently when you are bored or lonely, not worry about what you eat when you are in a good mood and eat as a reward. She shared she craves ice cream and milk shakes. Paige Stout believes the onset of emotional eating was likely in college and described the current frequency of emotional eating as "few times a week." In addition, Paige Stout denied a history of binge eating. Paige Stout denied a history of restricting food intake, purging and engagement in other compensatory strategies, and has never been diagnosed with an eating disorder. She also denied a history of treatment for emotional eating. Moreover, Paige Stout indicated stress triggers emotional eating, whereas being aware and exercising makes emotional eating better. Furthermore, Paige Stout denied other problems of concern.    Mental Status Examination:  Appearance: well groomed and appropriate hygiene  Behavior: appropriate to circumstances Mood: euthymic Affect: mood congruent Speech: normal in rate, volume, and tone Eye Contact: appropriate Psychomotor Activity: appropriate Gait: unable to assess Thought Process: linear, logical, and goal directed  Thought Content/Perception: denies suicidal and homicidal ideation, plan, and intent and no hallucinations, delusions, bizarre thinking or behavior reported or observed Orientation: time, person, place and purpose of appointment Memory/Concentration: memory, attention, language, and fund of knowledge intact  Insight/Judgment: good  Family & Psychosocial History: Paige Stout reported she is married and she does not have any children. 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 Additionally, Paige Stout shared her highest level of education obtained is a master's degree. Currently, Paige Stout's social support  system consists of her parents, sister, friends, and husband. Moreover, Paige Stout stated she resides with her husband.   Medical History:  Past Medical History:  Diagnosis Date  . Constipation   . Dysmenorrhea 09/19/2018  . Female infertility   . Headache   . Joint pain   . Menorrhagia 10/04/2018  . PCOS (polycystic ovarian syndrome)   . Shingles 2013  . Uterine leiomyoma 09/29/2018  . Vitamin B 12 deficiency   . Vitamin D deficiency    Past Surgical History:  Procedure Laterality Date  . LAPAROSCOPY N/A 11/12/2018   Procedure: LAPAROSCOPY OPERATIVE, Peritoneal Biopsies;  Surgeon: Eldred Manges, MD;  Location: [facility];  Service: Gynecology;  Laterality: N/A;   Current Outpatient Medications on File Prior to Visit  Medication Sig Dispense Refill  . ascorbic acid (VITAMIN C) 500 MG tablet Take 500 mg by mouth daily.    . Prenatal Vit-Fe Fumarate-FA (PRENATAL VITAMIN PLUS LOW IRON PO) Take 1 tablet by mouth daily.    Marland Kitchen zinc gluconate 50 MG tablet Take 50 mg by mouth daily.     No current facility-administered medications on file prior to visit.  Janann denied a history of head injuries and loss of consciousness.    Mental Health History: Paige Stout reported she previously attended therapeutic services approximately three years ago to address anxiety and ongoing stressors. She noted she is in the process of initiating services with Paige Stout to address ongoing stressors, noting they have not scheduled an initial appointment yet. Adahlia reported there is no history of hospitalizations for psychiatric concerns, and she has never met with a psychiatrist. Paige Stout stated she has never been prescribed psychotropic medications. Vianna denied a family history of mental health related concerns. Kelechi reported there is no history of trauma including psychological, physical  and sexual abuse, as well as neglect.   Ashya described her typical mood lately as "happy." Aside from concerns  noted  above and endorsed on the PHQ-9 and GAD-7, Starlet reported experiencing "competing responsibilities." Tyannah denied current alcohol use. She denied tobacco use. She denied illicit/recreational substance use. Regarding caffeine intake, Dola reported consuming one cup of green tea daily. Furthermore, Shalae indicated she is not experiencing the following: hallucinations and delusions, paranoia, symptoms of mania , social withdrawal, crying spells, panic attacks and decreased motivation. She also denied history of and current suicidal ideation, plan, and intent; history of and current homicidal ideation, plan, and intent; and history of and current engagement in self-harm.  The following strengths were reported by Lauretta: joyful person, love organization, good communicator, easy going, and flexible. The following strengths were observed by this provider: ability to express thoughts and feelings during the therapeutic session, ability to establish and benefit from a therapeutic relationship, willingness to work toward established goal(s) with the clinic and ability to engage in reciprocal conversation.  Legal History: Leronda reported there is no history of legal involvement.   Structured Assessments Results: The Patient Health Questionnaire-9 (PHQ-9) is a self-report measure that assesses symptoms and severity of depression over the course of the last two weeks. Crystie obtained a score of 1 suggesting minimal depression. Consandra finds the endorsed symptoms to be not difficult at all. [0= Not at all; 1= Several days; 2= More than half the days; 3= Nearly every day] Little interest or pleasure in doing things 0  Feeling down, depressed, or hopeless 0  Trouble falling or staying asleep, or sleeping too much 0  Feeling tired or having little energy 1  Poor appetite or overeating 0  Feeling bad about yourself --- or that you are a failure or have let yourself or your family down 0  Trouble concentrating on  things, such as reading the newspaper or watching television 0  Moving or speaking so slowly that other people could have noticed? Or the opposite --- being so fidgety or restless that you have been moving around a lot more than usual 0  Thoughts that you would be better off dead or hurting yourself in some way 0  PHQ-9 Score 1    The Generalized Anxiety Disorder-7 (GAD-7) is a brief self-report measure that assesses symptoms of anxiety over the course of the last two weeks. Karrie obtained a score of 3 suggesting minimal anxiety. Clairissa finds the endorsed symptoms to be not difficult at all. [0= Not at all; 1= Several days; 2= Over half the days; 3= Nearly every day] Feeling nervous, anxious, on edge 0  Not being able to stop or control worrying 0  Worrying too much about different things 3  Trouble relaxing 0  Being so restless that it's hard to sit still 0  Becoming easily annoyed or irritable 0  Feeling afraid as if something awful might happen 0  GAD-7 Score 3   Interventions:  Conducted a chart review Focused on rapport building Verbally administered PHQ-9 and GAD-7 for symptom monitoring Verbally administered Food & Mood questionnaire to assess various behaviors related to emotional eating Provided emphatic reflections and validation Collaborated with patient on a treatment goal  Psychoeducation provided regarding physical versus emotional hunger  Provisional DSM-5 Diagnosis(es): 307.59 (F50.8) Other Specified Feeding or Eating Disorder, Emotional Eating Behaviors  Plan: Kashara appears able and willing to participate as evidenced by collaboration on a treatment goal, engagement in reciprocal conversation, and asking questions as needed for clarification. Per Quinley's request as she will be initiating services with a new provider, the next appointment with  this provider will be scheduled in one month, which will be via MyChart Video Visit. The following treatment goal was  established: increase coping skills. This provider will regularly review the treatment plan and medical chart to keep informed of status changes. Keimani expressed understanding and agreement with the initial treatment plan of care. Morgaine will be sent a handout via e-mail to utilize between now and the next appointment to increase awareness of hunger patterns and subsequent eating. Naijah provided verbal consent during today's appointment for this provider to send the handout via e-mail.

## 2020-04-15 ENCOUNTER — Telehealth (INDEPENDENT_AMBULATORY_CARE_PROVIDER_SITE_OTHER): Payer: 59 | Admitting: Psychology

## 2020-04-15 DIAGNOSIS — F5089 Other specified eating disorder: Secondary | ICD-10-CM

## 2020-04-15 LAB — CBC WITH DIFFERENTIAL/PLATELET
Basophils Absolute: 0.1 10*3/uL (ref 0.0–0.2)
Basos: 1 %
EOS (ABSOLUTE): 0.6 10*3/uL — ABNORMAL HIGH (ref 0.0–0.4)
Eos: 5 %
Hematocrit: 40.4 % (ref 34.0–46.6)
Hemoglobin: 13.5 g/dL (ref 11.1–15.9)
Immature Grans (Abs): 0 10*3/uL (ref 0.0–0.1)
Immature Granulocytes: 0 %
Lymphocytes Absolute: 3.4 10*3/uL — ABNORMAL HIGH (ref 0.7–3.1)
Lymphs: 32 %
MCH: 30.1 pg (ref 26.6–33.0)
MCHC: 33.4 g/dL (ref 31.5–35.7)
MCV: 90 fL (ref 79–97)
Monocytes Absolute: 0.5 10*3/uL (ref 0.1–0.9)
Monocytes: 5 %
Neutrophils Absolute: 6.2 10*3/uL (ref 1.4–7.0)
Neutrophils: 57 %
Platelets: 367 10*3/uL (ref 150–450)
RBC: 4.49 x10E6/uL (ref 3.77–5.28)
RDW: 12.8 % (ref 11.7–15.4)
WBC: 10.7 10*3/uL (ref 3.4–10.8)

## 2020-04-15 LAB — COMPREHENSIVE METABOLIC PANEL
ALT: 11 IU/L (ref 0–32)
AST: 16 IU/L (ref 0–40)
Albumin/Globulin Ratio: 1.3 (ref 1.2–2.2)
Albumin: 4.3 g/dL (ref 3.8–4.8)
Alkaline Phosphatase: 84 IU/L (ref 48–121)
BUN/Creatinine Ratio: 13 (ref 9–23)
BUN: 9 mg/dL (ref 6–20)
Bilirubin Total: 0.2 mg/dL (ref 0.0–1.2)
CO2: 25 mmol/L (ref 20–29)
Calcium: 9.8 mg/dL (ref 8.7–10.2)
Chloride: 101 mmol/L (ref 96–106)
Creatinine, Ser: 0.68 mg/dL (ref 0.57–1.00)
GFR calc Af Amer: 135 mL/min/{1.73_m2} (ref >59)
GFR calc non Af Amer: 117 mL/min/{1.73_m2} (ref >59)
Globulin, Total: 3.4 g/dL (ref 1.5–4.5)
Glucose: 83 mg/dL (ref 65–99)
Potassium: 4.3 mmol/L (ref 3.5–5.2)
Sodium: 138 mmol/L (ref 134–144)
Total Protein: 7.7 g/dL (ref 6.0–8.5)

## 2020-04-15 LAB — VITAMIN D 25 HYDROXY (VIT D DEFICIENCY, FRACTURES): Vit D, 25-Hydroxy: 16.2 ng/mL — ABNORMAL LOW (ref 30.0–100.0)

## 2020-04-15 LAB — HEMOGLOBIN A1C
Est. average glucose Bld gHb Est-mCnc: 108 mg/dL
Hgb A1c MFr Bld: 5.4 % (ref 4.8–5.6)

## 2020-04-15 LAB — INSULIN, RANDOM: INSULIN: 18.2 u[IU]/mL (ref 2.6–24.9)

## 2020-04-15 LAB — LIPID PANEL WITH LDL/HDL RATIO
Cholesterol, Total: 188 mg/dL (ref 100–199)
HDL: 69 mg/dL (ref >39)
LDL Chol Calc (NIH): 95 mg/dL (ref 0–99)
LDL/HDL Ratio: 1.4 ratio (ref 0.0–3.2)
Triglycerides: 140 mg/dL (ref 0–149)
VLDL Cholesterol Cal: 24 mg/dL (ref 5–40)

## 2020-04-15 LAB — T3: T3, Total: 163 ng/dL (ref 71–180)

## 2020-04-15 LAB — FOLATE: Folate: 4.7 ng/mL (ref >3.0)

## 2020-04-15 LAB — TSH: TSH: 1.98 u[IU]/mL (ref 0.450–4.500)

## 2020-04-15 LAB — T4, FREE: Free T4: 1.18 ng/dL (ref 0.82–1.77)

## 2020-04-15 LAB — VITAMIN B12: Vitamin B-12: 711 pg/mL (ref 232–1245)

## 2020-04-15 NOTE — Progress Notes (Signed)
Chief Complaint:   OBESITY Paige Stout (MR# [id number]) is a 29 y.o. female who presents for evaluation and treatment of obesity and related comorbidities. Current BMI is Body mass index is 32.12 kg/m. Paige Stout has been struggling with her weight for many years and has been unsuccessful in either losing weight, maintaining weight loss, or reaching her healthy weight goal.  Paige Stout is currently in the action stage of change and ready to dedicate time achieving and maintaining a healthier weight. Paige Stout is interested in becoming our patient and working on intensive lifestyle modifications including (but not limited to) diet and exercise for weight loss.  Paige Stout's habits were reviewed today and are as follows: Her family eats meals together, she thinks her family will eat healthier with her, her desired weight loss is 29-34 lbs, she has been heavy most of her life, she started gaining weight in 2017, her heaviest weight ever was 222 pounds, she has significant food cravings issues, she snacks frequently in the evenings, she skips meals frequently, she is frequently drinking liquids with calories, she frequently makes poor food choices, she frequently eats larger portions than normal and she struggles with emotional eating.  Depression Screen Paige Stout's Food and Mood (modified PHQ-9) score was 6.  Depression screen PHQ 2/9 04/14/2020  Decreased Interest 1  Down, Depressed, Hopeless 0  PHQ - 2 Score 1  Altered sleeping 1  Tired, decreased energy 1  Change in appetite 2  Feeling bad or failure about yourself  0  Trouble concentrating 1  Moving slowly or fidgety/restless 0  Suicidal thoughts 0  PHQ-9 Score 6  Difficult doing work/chores Somewhat difficult   Subjective:   1. Other fatigue Paige Stout admits to daytime somnolence and admits to waking up still tired. Patent has a history of symptoms of daytime fatigue and morning headache. Paige Stout generally gets 5 or 6 hours of sleep per  night, and states that she has nightime awakenings. Snoring is present. Apneic episodes are not present. Epworth Sleepiness Score is 13.  2. Vitamin D deficiency Paige Stout reports low energy levels. She is not on supplementation.  3. Vitamin B 12 deficiency Paige Stout reports low energy levels. She is not on supplementation.  4. History of PCOS Paige Stout reports a history of infertility in the past. She is not currently on metformin.  5. Other depression with emotional eating Paige Stout has a history of emotional eating, and will eat when she is stressed, sad, upset, or bored. Her PHQ-9 score is 6.  6. At risk for constipation Paige Stout is at increased risk for constipation due to weight loss and previous history of constipation. Paige Stout denies hard, infrequent stools currently.   Assessment/Plan:   1. Other fatigue Paige Stout does feel that her weight is causing her energy to be lower than it should be. Fatigue may be related to obesity, depression or many other causes. Labs will be ordered, and in the meanwhile, Paige Stout will focus on self care including making healthy food choices, increasing physical activity and focusing on stress reduction.  - EKG 12-Lead - Comprehensive metabolic panel - CBC with Differential/Platelet - Lipid Panel With LDL/HDL Ratio - Folate - TSH - T4, free - T3  2. Vitamin D deficiency Low Vitamin D level contributes to fatigue and are associated with obesity, breast, and colon cancer. We will check labs today, and Paige Stout will follow-up for routine testing of Vitamin D, at least 2-3 times per year to avoid over-replacement.  - VITAMIN D 25 Hydroxy (Vit-D  Deficiency, Fractures)  3. Vitamin B 12 deficiency The diagnosis was reviewed with the patient. We will continue to monitor. We will check labs today, and Paige Stout will start her Category 2 meal plan. Orders and follow up as documented in patient record.  - CBC with Differential/Platelet - Vitamin B12 - Folate  4. History  of PCOS Intensive lifestyle modifications are first line treatment for this issue. We discussed several lifestyle modifications today and she will continue to work on diet, exercise and weight loss efforts. We will check labs today. Orders and follow up as documented in patient record.  - Hemoglobin A1c - Insulin, random  5. Other depression with emotional eating Behavior modification techniques were discussed today to help Paige Stout deal with her emotional/non-hunger eating behaviors. We will refer to Dr. Mallie Mussel, our Bariatric Psychologist for evaluation. Orders and follow up as documented in patient record.   6. At risk for constipation Paige Stout was given approximately 30 minutes of counseling today regarding prevention of constipation. She was encouraged to increase water and fiber intake.   7. Class 1 obesity with serious comorbidity and body mass index (BMI) of 32.0 to 32.9 in adult, unspecified obesity type Paige Stout is currently in the action stage of change and her goal is to continue with weight loss efforts. I recommend Paige Stout begin the structured treatment plan as follows:  She has agreed to the Category 2 Plan.  Exercise goals: As is.   Behavioral modification strategies: increasing lean protein intake, decreasing simple carbohydrates, increasing water intake, decreasing eating out, no skipping meals and meal planning and cooking strategies.  She was informed of the importance of frequent follow-up visits to maximize her success with intensive lifestyle modifications for her multiple health conditions. She was informed we would discuss her lab results at her next visit unless there is a critical issue that needs to be addressed sooner. Paige Stout agreed to keep her next visit at the agreed upon time to discuss these results.  Objective:   Blood pressure 109/75, pulse 79, temperature 98.3 F (36.8 C), temperature source Oral, height 5\' 6"  (1.676 m), weight 199 lb (90.3 kg), last menstrual  period 04/10/2020, SpO2 99 %. Body mass index is 32.12 kg/m.  EKG: Normal sinus rhythm, rate 80 BPM.  Indirect Calorimeter completed today shows a VO2 of 190 and a REE of 1322.  Her calculated basal metabolic rate is [id number] thus her basal metabolic rate is worse than expected.  General: Cooperative, alert, well developed, in no acute distress. HEENT: Conjunctivae and lids unremarkable. Cardiovascular: Regular rhythm.  Lungs: Normal work of breathing. Neurologic: No focal deficits.   Lab Results  Component Value Date   CREATININE 0.61 08/07/2019   BUN 9 08/07/2019   NA 138 08/07/2019   K 4.1 08/07/2019   CL 103 08/07/2019   CO2 29 08/07/2019   Lab Results  Component Value Date   ALT 11 08/07/2019   AST 15 08/07/2019   ALKPHOS 56 08/07/2019   BILITOT 0.4 08/07/2019   Lab Results  Component Value Date   HGBA1C 5.6 08/07/2019   HGBA1C 5.5 10/02/2018   No results found for: INSULIN Lab Results  Component Value Date   TSH 2.425 11/22/2019   Lab Results  Component Value Date   CHOL 139 08/07/2019   HDL 53.60 08/07/2019   LDLCALC 74 08/07/2019   TRIG 57.0 08/07/2019   CHOLHDL 3 08/07/2019   Lab Results  Component Value Date   WBC 8.2 08/07/2019   HGB 12.6 08/07/2019  HCT 37.8 08/07/2019   MCV 87.9 08/07/2019   PLT 380.0 08/07/2019   No results found for: IRON, TIBC, FERRITIN  Attestation Statements:   Reviewed by clinician on day of visit: allergies, medications, problem list, medical history, surgical history, family history, social history, and previous encounter notes.   I, Trixie Dredge, am acting as transcriptionist for Dennard Nip, MD.  I have reviewed the above documentation for accuracy and completeness, and I agree with the above. - Dennard Nip, MD

## 2020-04-28 ENCOUNTER — Ambulatory Visit (INDEPENDENT_AMBULATORY_CARE_PROVIDER_SITE_OTHER): Payer: 59 | Admitting: Family Medicine

## 2020-04-28 ENCOUNTER — Encounter (INDEPENDENT_AMBULATORY_CARE_PROVIDER_SITE_OTHER): Payer: Self-pay | Admitting: Family Medicine

## 2020-04-28 ENCOUNTER — Other Ambulatory Visit: Payer: Self-pay

## 2020-04-28 VITALS — BP 105/71 | HR 80 | Temp 98.4°F | Ht 66.0 in | Wt 196.0 lb

## 2020-04-28 DIAGNOSIS — E669 Obesity, unspecified: Secondary | ICD-10-CM

## 2020-04-28 DIAGNOSIS — E8881 Metabolic syndrome: Secondary | ICD-10-CM

## 2020-04-28 DIAGNOSIS — E66811 Obesity, class 1: Secondary | ICD-10-CM

## 2020-04-28 DIAGNOSIS — E559 Vitamin D deficiency, unspecified: Secondary | ICD-10-CM

## 2020-04-28 DIAGNOSIS — Z9189 Other specified personal risk factors, not elsewhere classified: Secondary | ICD-10-CM

## 2020-04-28 DIAGNOSIS — E88819 Insulin resistance, unspecified: Secondary | ICD-10-CM

## 2020-04-28 DIAGNOSIS — Z6831 Body mass index (BMI) 31.0-31.9, adult: Secondary | ICD-10-CM

## 2020-04-28 MED ORDER — METFORMIN HCL 500 MG PO TABS: 250.0000 mg | ORAL_TABLET | Freq: Every day | ORAL | 0 refills | Status: DC

## 2020-04-28 MED ORDER — VITAMIN D (ERGOCALCIFEROL) 1.25 MG (50000 UNIT) PO CAPS: 50000.0000 [IU] | ORAL_CAPSULE | ORAL | 0 refills | Status: DC

## 2020-04-28 NOTE — Progress Notes (Signed)
Chief Complaint:   OBESITY Paige Stout is here to discuss her progress with her obesity treatment plan along with follow-up of her obesity related diagnoses. Paige Stout is on the Category 2 Plan and states she is following her eating plan approximately 70% of the time. Sarajean states she is walking and high intensity training for 30 minutes 7 times per week.  Today's visit was #: 2 Starting weight: 199 lbs Starting date: 04/14/2020 Today's weight: 196 lbs Today's date: 04/28/2020 Total lbs lost to date: 3 Total lbs lost since last in-office visit: 3  Interim History: Journey felt that her hunger levels increased in the late afternoon and early evening. She felt that the dinner protein oz serving was a large serving and difficult to fully consume.  Subjective:   1. Vitamin D deficiency Rynn's Vit D level on 04/14/2020 was 16.2. She has been intermittently taking OTC Vit D in the past. I discussed labs with the patient today.  2. Insulin resistance Paige Stout's insulin level on 04/14/2020 was 18.2. and A1c was 5.4. She has been on metformin in the past and experienced diffuse joint aches, especially in her knee's. I discussed labs with the patient today.  3. At risk for side effect of medication Latarshia is at risk for drug side effects due to restarting metformin to treat insulin resistance.  Assessment/Plan:   1. Vitamin D deficiency Low Vitamin D level contributes to fatigue and are associated with obesity, breast, and colon cancer. Paige Stout agreed to start prescription Vitamin D 50,000 IU every week with no refills. She will follow-up for routine testing of Vitamin D, at least 2-3 times per year to avoid over-replacement. We will recheck labs in 3 months.  - Vitamin D, Ergocalciferol, (DRISDOL) 1.25 MG (50000 UNIT) CAPS capsule; Take 1 capsule (50,000 Units total) by mouth every 7 (seven) days.  Dispense: 4 capsule; Refill: 0  2. Insulin resistance Paige Stout will continue to work on weight loss,  exercise, and decreasing simple carbohydrates to help decrease the risk of diabetes. Paige Stout agreed to start metformin 500 mg 1/2 tablet with breakfast with no refills. We will recheck labs in 3 months. Paige Stout agreed to follow-up with [place] as directed to closely monitor her progress.  - metFORMIN (GLUCOPHAGE) 500 MG tablet; Take 0.5 tablets (250 mg total) by mouth daily with breakfast.  Dispense: 15 tablet; Refill: 0  3. At risk for side effect of medication [name] was given approximately 30 minutes of drug side effect counseling today. We discussed side effect possibility and risk versus benefits. [name] agreed to the medication and will contact this office if these side effects are intolerable.  Repetitive spaced learning was employed today to elicit superior memory formation and behavioral change.  4. Class 1 obesity with serious comorbidity and body mass index (BMI) of 31.0 to 31.9 in adult, unspecified obesity type Paige Stout is currently in the action stage of change. As such, her goal is to continue with weight loss efforts. She has agreed to the Category 2 Plan.   Handout provided today: Insulin Resistance and Pre-diabetes, and Protein Equivalents.  Exercise goals: As is.  Behavioral modification strategies: increasing lean protein intake, decreasing simple carbohydrates, meal planning and cooking strategies and celebration eating strategies.  Paige Stout has agreed to follow-up with our clinic in 2 weeks. She was informed of the importance of frequent follow-up visits to maximize her success with intensive lifestyle modifications for her multiple health conditions.   Objective:   Blood pressure 105/71, pulse 80, temperature  98.4 F (36.9 C), temperature source Oral, height 5\' 6"  (1.676 m), weight 196 lb (88.9 kg), last menstrual period 04/10/2020, SpO2 100 %. Body mass index is 31.64 kg/m.  General: Cooperative, alert, well developed, in no acute distress. HEENT: Conjunctivae and lids  unremarkable. Cardiovascular: Regular rhythm.  Lungs: Normal work of breathing. Neurologic: No focal deficits.   Lab Results  Component Value Date   CREATININE 0.68 04/14/2020   BUN 9 04/14/2020   NA 138 04/14/2020   K 4.3 04/14/2020   CL 101 04/14/2020   CO2 25 04/14/2020   Lab Results  Component Value Date   ALT 11 04/14/2020   AST 16 04/14/2020   ALKPHOS 84 04/14/2020   BILITOT 0.2 04/14/2020   Lab Results  Component Value Date   HGBA1C 5.4 04/14/2020   HGBA1C 5.6 08/07/2019   HGBA1C 5.5 10/02/2018   Lab Results  Component Value Date   INSULIN 18.2 04/14/2020   Lab Results  Component Value Date   TSH 1.980 04/14/2020   Lab Results  Component Value Date   CHOL 188 04/14/2020   HDL 69 04/14/2020   LDLCALC 95 04/14/2020   TRIG 140 04/14/2020   CHOLHDL 3 08/07/2019   Lab Results  Component Value Date   WBC 10.7 04/14/2020   HGB 13.5 04/14/2020   HCT 40.4 04/14/2020   MCV 90 04/14/2020   PLT 367 04/14/2020   No results found for: IRON, TIBC, FERRITIN  Attestation Statements:   Reviewed by clinician on day of visit: allergies, medications, problem list, medical history, surgical history, family history, social history, and previous encounter notes.   I, Trixie Dredge, am acting as transcriptionist for Dennard Nip, MD.  I have reviewed the above documentation for accuracy and completeness, and I agree with the above. -  Dennard Nip, MD

## 2020-04-29 NOTE — Progress Notes (Signed)
  Office: [phone]  /  Fax: [phone]    Date: May 13, 2020   Appointment Start Time: 1:57pm Duration: 25 minutes Provider: Glennie Isle, Psy.D. Type of Session: Individual Therapy  Location of Patient: Home Location of Provider: Provider's Home Type of Contact: Telepsychological Visit via MyChart Video Visit  Session Content: Paige Stout is a 29 y.o. female presenting via MyChart Video Visit for a follow-up appointment to address the previously established treatment goal of increasing coping skills. Today's appointment was a telepsychological visit due to COVID-19. Paige Stout provided verbal consent for today's telepsychological appointment and she is aware she is responsible for securing confidentiality on her end of the session. Prior to proceeding with today's appointment, Paige Stout's physical location at the time of this appointment was obtained as well a phone number she could be reached at in the event of technical difficulties. Paige Stout and this provider participated in today's telepsychological service.   This provider conducted a brief check-in. Paige Stout stated she did well initially with the meal plan, but noted recent deviations. She added, "I just have a lot going on." Notably, Paige Stout is working toward getting back on track. Additionally, Paige Stout reported she was prescribed metformin again based on recent labs, which resulted in an increase in anxiety and feeling of failure. This was explored further and processed. She was receptive to scheduling an appointment with Dr. Leafy Ro to further discuss concerns about metformin so that she can make an informed decision. Paige Stout was receptive to today's appointment as evidenced by openness to sharing and responsiveness to feedback.  Mental Status Examination:  Appearance: well groomed and appropriate hygiene  Behavior: appropriate to circumstances Mood: euthymic Affect: mood congruent Speech: normal in rate, volume, and tone Eye Contact:  appropriate Psychomotor Activity: appropriate Gait: unable to assess Thought Process: linear, logical, and goal directed  Thought Content/Perception: no hallucinations, delusions, bizarre thinking or behavior reported or observed and no evidence of suicidal and homicidal ideation, plan, and intent Orientation: time, person, place and purpose of appointment Memory/Concentration: memory, attention, language, and fund of knowledge intact  Insight/Judgment: good  Interventions:  Conducted a brief chart review Provided empathic reflections and validation Employed supportive psychotherapy interventions to facilitate reduced distress and to improve coping skills with identified stressors Employed motivational interviewing skills to assess patient's willingness/desire to adhere to recommended medical treatments and assignments  DSM-5 Diagnosis(es): 307.59 (F50.8) Other Specified Feeding or Eating Disorder, Emotional Eating Behaviors  Treatment Goal & Progress: During the initial appointment with this provider, the following treatment goal was established: increase coping skills. Paige Stout has demonstrated progress in her goal as evidenced by increased awareness of hunger patterns.   Plan: Paige Stout declined future appointments with this provider as she initiated services with a new behavioral health provider (Dr. Gloriann Loan), noting their next appointment is tomorrow. She acknowledged understanding that she may request a follow-up appointment with this provider in the future as long as she is still established with the clinic. No further follow-up planned by this provider.

## 2020-05-12 ENCOUNTER — Ambulatory Visit (INDEPENDENT_AMBULATORY_CARE_PROVIDER_SITE_OTHER): Payer: 59 | Admitting: Adult Health

## 2020-05-13 ENCOUNTER — Other Ambulatory Visit: Payer: Self-pay

## 2020-05-13 ENCOUNTER — Telehealth (INDEPENDENT_AMBULATORY_CARE_PROVIDER_SITE_OTHER): Payer: 59 | Admitting: Psychology

## 2020-05-13 DIAGNOSIS — F5089 Other specified eating disorder: Secondary | ICD-10-CM

## 2020-05-27 ENCOUNTER — Ambulatory Visit (INDEPENDENT_AMBULATORY_CARE_PROVIDER_SITE_OTHER): Payer: 59 | Admitting: Family Medicine

## 2020-05-27 ENCOUNTER — Encounter (INDEPENDENT_AMBULATORY_CARE_PROVIDER_SITE_OTHER): Payer: Self-pay | Admitting: Family Medicine

## 2020-05-27 ENCOUNTER — Other Ambulatory Visit: Payer: Self-pay

## 2020-05-27 VITALS — BP 108/76 | HR 79 | Temp 98.1°F | Ht 66.0 in | Wt 196.0 lb

## 2020-05-27 DIAGNOSIS — E669 Obesity, unspecified: Secondary | ICD-10-CM

## 2020-05-27 DIAGNOSIS — E559 Vitamin D deficiency, unspecified: Secondary | ICD-10-CM | POA: Diagnosis not present

## 2020-05-27 DIAGNOSIS — E8881 Metabolic syndrome: Secondary | ICD-10-CM | POA: Diagnosis not present

## 2020-05-27 DIAGNOSIS — Z9189 Other specified personal risk factors, not elsewhere classified: Secondary | ICD-10-CM

## 2020-05-27 DIAGNOSIS — Z6831 Body mass index (BMI) 31.0-31.9, adult: Secondary | ICD-10-CM

## 2020-05-27 MED ORDER — METFORMIN HCL 500 MG PO TABS: 250.0000 mg | ORAL_TABLET | Freq: Every day | ORAL | 0 refills | Status: DC

## 2020-05-27 MED ORDER — VITAMIN D (ERGOCALCIFEROL) 1.25 MG (50000 UNIT) PO CAPS: 50000.0000 [IU] | ORAL_CAPSULE | ORAL | 0 refills | Status: DC

## 2020-06-02 NOTE — Progress Notes (Signed)
Chief Complaint:   OBESITY Paige Stout is here to discuss her progress with her obesity treatment plan along with follow-up of her obesity related diagnoses. Paige Stout is on the Category 2 Plan and states she is following her eating plan approximately 30% of the time. Paige Stout states she is walking, and doing group fitness classes for 30 minutes 2-3 times per week.  Today's visit was #: 3 Starting weight: 199 lbs Starting date: 04/14/2020 Today's weight: 196 lbs Today's date: 05/27/2020 Total lbs lost to date: 3 Total lbs lost since last in-office visit: 0  Interim History: Paige Stout is struggling with weight loss on her Category 2 plan. She is frustrated that she hasn't lost weight better even with exercise.  Subjective:   1. Vitamin D deficiency Alinda is stable on Vit D, and she denies nausea, vomiting, or muscle weakness.  2. Insulin resistance Paige Stout started metformin 3 days ago and she is not having any side effects. She is back with her eating plan.  3. At risk for diabetes mellitus Paige Stout is at higher than average risk for developing diabetes due to her obesity.   Assessment/Plan:   1. Vitamin D deficiency Low Vitamin D level contributes to fatigue and are associated with obesity, breast, and colon cancer. We will refill prescription Vitamin D for 1 month. Raymond will follow-up for routine testing of Vitamin D, at least 2-3 times per year to avoid over-replacement.  - Vitamin D, Ergocalciferol, (DRISDOL) 1.25 MG (50000 UNIT) CAPS capsule; Take 1 capsule (50,000 Units total) by mouth every 7 (seven) days.  Dispense: 4 capsule; Refill: 0  2. Insulin resistance Paige Stout will continue to work on weight loss, exercise, and decreasing simple carbohydrates to help decrease the risk of diabetes. We will refill metformin for 1 month. Paige Stout agreed to follow-up with [place] as directed to closely monitor her progress.  - metFORMIN (GLUCOPHAGE) 500 MG tablet; Take 0.5 tablets (250 mg total) by  mouth daily with breakfast.  Dispense: 15 tablet; Refill: 0  3. At risk for diabetes mellitus Paige Stout was given approximately 15 minutes of diabetes education and counseling today. We discussed intensive lifestyle modifications today with an emphasis on weight loss as well as increasing exercise and decreasing simple carbohydrates in her diet. We also reviewed medication options with an emphasis on risk versus benefit of those discussed.   Repetitive spaced learning was employed today to elicit superior memory formation and behavioral change.  4. Class 1 obesity with serious comorbidity and body mass index (BMI) of 31.0 to 31.9 in adult, unspecified obesity type Paige Stout is currently in the action stage of change. As such, her goal is to continue with weight loss efforts. She has agreed to change to keeping a food journal and adhering to recommended goals of 1100-1300 calories and 75+ grams of protein daily.   Paige Stout was reminded that we are trying to increase her RMR and then her weight loss will do better. She will change her plan to journaling.  Exercise goals: As is.  Behavioral modification strategies: keeping a strict food journal.  Paige Stout has agreed to follow-up with our clinic in 2 to 3 weeks. She was informed of the importance of frequent follow-up visits to maximize her success with intensive lifestyle modifications for her multiple health conditions.   Objective:   Blood pressure 108/76, pulse 79, temperature 98.1 F (36.7 C), temperature source Oral, height 5\' 6"  (1.676 m), weight 196 lb (88.9 kg), last menstrual period 05/17/2020, SpO2 97 %. Body mass  index is 31.64 kg/m.  General: Cooperative, alert, well developed, in no acute distress. HEENT: Conjunctivae and lids unremarkable. Cardiovascular: Regular rhythm.  Lungs: Normal work of breathing. Neurologic: No focal deficits.   Lab Results  Component Value Date   CREATININE 0.68 04/14/2020   BUN 9 04/14/2020   NA 138  04/14/2020   K 4.3 04/14/2020   CL 101 04/14/2020   CO2 25 04/14/2020   Lab Results  Component Value Date   ALT 11 04/14/2020   AST 16 04/14/2020   ALKPHOS 84 04/14/2020   BILITOT 0.2 04/14/2020   Lab Results  Component Value Date   HGBA1C 5.4 04/14/2020   HGBA1C 5.6 08/07/2019   HGBA1C 5.5 10/02/2018   Lab Results  Component Value Date   INSULIN 18.2 04/14/2020   Lab Results  Component Value Date   TSH 1.980 04/14/2020   Lab Results  Component Value Date   CHOL 188 04/14/2020   HDL 69 04/14/2020   LDLCALC 95 04/14/2020   TRIG 140 04/14/2020   CHOLHDL 3 08/07/2019   Lab Results  Component Value Date   WBC 10.7 04/14/2020   HGB 13.5 04/14/2020   HCT 40.4 04/14/2020   MCV 90 04/14/2020   PLT 367 04/14/2020   No results found for: IRON, TIBC, FERRITIN  Attestation Statements:   Reviewed by clinician on day of visit: allergies, medications, problem list, medical history, surgical history, family history, social history, and previous encounter notes.   I, Paige Stout, am acting as transcriptionist for Dennard Nip, MD.  I have reviewed the above documentation for accuracy and completeness, and I agree with the above. -  Dennard Nip, MD

## 2020-06-17 ENCOUNTER — Ambulatory Visit (INDEPENDENT_AMBULATORY_CARE_PROVIDER_SITE_OTHER): Payer: 59 | Admitting: Family Medicine

## 2020-06-17 ENCOUNTER — Encounter (INDEPENDENT_AMBULATORY_CARE_PROVIDER_SITE_OTHER): Payer: Self-pay | Admitting: Family Medicine

## 2020-06-17 ENCOUNTER — Other Ambulatory Visit: Payer: Self-pay

## 2020-06-17 VITALS — BP 108/78 | HR 70 | Temp 97.9°F | Ht 66.0 in | Wt 197.0 lb

## 2020-06-17 DIAGNOSIS — R7303 Prediabetes: Secondary | ICD-10-CM | POA: Diagnosis not present

## 2020-06-17 DIAGNOSIS — E559 Vitamin D deficiency, unspecified: Secondary | ICD-10-CM | POA: Diagnosis not present

## 2020-06-17 DIAGNOSIS — Z6831 Body mass index (BMI) 31.0-31.9, adult: Secondary | ICD-10-CM

## 2020-06-17 DIAGNOSIS — Z9189 Other specified personal risk factors, not elsewhere classified: Secondary | ICD-10-CM | POA: Diagnosis not present

## 2020-06-17 DIAGNOSIS — E669 Obesity, unspecified: Secondary | ICD-10-CM

## 2020-06-17 MED ORDER — METFORMIN HCL 500 MG PO TABS: 500.0000 mg | ORAL_TABLET | Freq: Every day | ORAL | 0 refills | Status: DC

## 2020-06-17 MED ORDER — VITAMIN D (ERGOCALCIFEROL) 1.25 MG (50000 UNIT) PO CAPS: 50000.0000 [IU] | ORAL_CAPSULE | ORAL | 0 refills | Status: DC

## 2020-06-22 NOTE — Progress Notes (Signed)
Chief Complaint:   OBESITY Paige Stout is here to discuss her progress with her obesity treatment plan along with follow-up of her obesity related diagnoses. Paige Stout is on keeping a food journal and adhering to recommended goals of 1100-1300 calories and 75+ grams of protein daily and states she is following her eating plan approximately 75% of the time. Paige Stout states she is walking and exercising with a personal trainer for 45 minutes 4 times per week.  Today's visit was #: 4 Starting weight: 199 lbs Starting date: 04/14/2020 Today's weight: 197 lbs Today's date: 06/17/2020 Total lbs lost to date: 2 Total lbs lost since last in-office visit: 0  Interim History: Paige Stout has struggled with her plan more this last 3 weeks. She found journaling difficult at times and it appears her protein intake has decreased.  Subjective:   1. Vitamin D deficiency Paige Stout is stable on Vit D but her level is not yet at goal. She denies nausea or vomiting.  2. Pre-diabetes Paige Stout has questions about insulin resistance and eating. She is tolerating metformin well.  3. At risk for diabetes mellitus Paige Stout is at higher than average risk for developing diabetes due to her obesity.   Assessment/Plan:   1. Vitamin D deficiency Low Vitamin D level contributes to fatigue and are associated with obesity, breast, and colon cancer. We will refill prescription Vitamin D for 1 month. Paige Stout will follow-up for routine testing of Vitamin D, at least 2-3 times per year to avoid over-replacement.  - Vitamin D, Ergocalciferol, (DRISDOL) 1.25 MG (50000 UNIT) CAPS capsule; Take 1 capsule (50,000 Units total) by mouth every 7 (seven) days.  Dispense: 4 capsule; Refill: 0  2. Pre-diabetes Paige Stout will continue to work on weight loss, exercise, and decreasing simple carbohydrates to help decrease the risk of diabetes. Paige Stout agreed to increase metformin to 500 mg q AM and we will refill for 1 month. She was educated on insulin  resistance and the importance of diet in depth.  - metFORMIN (GLUCOPHAGE) 500 MG tablet; Take 1 tablet (500 mg total) by mouth daily with breakfast.  Dispense: 30 tablet; Refill: 0  3. At risk for diabetes mellitus Paige Stout was given approximately 15 minutes of diabetes education and counseling today. We discussed intensive lifestyle modifications today with an emphasis on weight loss as well as increasing exercise and decreasing simple carbohydrates in her diet. We also reviewed medication options with an emphasis on risk versus benefit of those discussed.   Repetitive spaced learning was employed today to elicit superior memory formation and behavioral change.  4. Class 1 obesity with serious comorbidity and body mass index (BMI) of 31.0 to 31.9 in adult, unspecified obesity type Paige Stout is currently in the action stage of change. As such, her goal is to continue with weight loss efforts. She has agreed to the Category 2 Plan or keeping a food journal and adhering to recommended goals of 1100-1300 calories and 75+ grams of protein daily.   Exercise goals: As is.  Behavioral modification strategies: increasing lean protein intake and keeping a strict food journal.  Paige Stout has agreed to follow-up with our clinic in 2 to 3 weeks. She was informed of the importance of frequent follow-up visits to maximize her success with intensive lifestyle modifications for her multiple health conditions.   Objective:   Blood pressure 108/78, pulse 70, temperature 97.9 F (36.6 C), temperature source Oral, height 5\' 6"  (1.676 m), weight 197 lb (89.4 kg), last menstrual period 05/16/2020, SpO2 98 %.  Body mass index is 31.8 kg/m.  General: Cooperative, alert, well developed, in no acute distress. HEENT: Conjunctivae and lids unremarkable. Cardiovascular: Regular rhythm.  Lungs: Normal work of breathing. Neurologic: No focal deficits.   Lab Results  Component Value Date   CREATININE 0.68 04/14/2020   BUN  9 04/14/2020   NA 138 04/14/2020   K 4.3 04/14/2020   CL 101 04/14/2020   CO2 25 04/14/2020   Lab Results  Component Value Date   ALT 11 04/14/2020   AST 16 04/14/2020   ALKPHOS 84 04/14/2020   BILITOT 0.2 04/14/2020   Lab Results  Component Value Date   HGBA1C 5.4 04/14/2020   HGBA1C 5.6 08/07/2019   HGBA1C 5.5 10/02/2018   Lab Results  Component Value Date   INSULIN 18.2 04/14/2020   Lab Results  Component Value Date   TSH 1.980 04/14/2020   Lab Results  Component Value Date   CHOL 188 04/14/2020   HDL 69 04/14/2020   LDLCALC 95 04/14/2020   TRIG 140 04/14/2020   CHOLHDL 3 08/07/2019   Lab Results  Component Value Date   WBC 10.7 04/14/2020   HGB 13.5 04/14/2020   HCT 40.4 04/14/2020   MCV 90 04/14/2020   PLT 367 04/14/2020   No results found for: IRON, TIBC, FERRITIN  Attestation Statements:   Reviewed by clinician on day of visit: allergies, medications, problem list, medical history, surgical history, family history, social history, and previous encounter notes.   I, Trixie Dredge, am acting as transcriptionist for Dennard Nip, MD.  I have reviewed the above documentation for accuracy and completeness, and I agree with the above. -  Dennard Nip, MD

## 2020-07-02 ENCOUNTER — Other Ambulatory Visit (INDEPENDENT_AMBULATORY_CARE_PROVIDER_SITE_OTHER): Payer: Self-pay | Admitting: Family Medicine

## 2020-07-02 DIAGNOSIS — E559 Vitamin D deficiency, unspecified: Secondary | ICD-10-CM

## 2020-07-06 ENCOUNTER — Ambulatory Visit (INDEPENDENT_AMBULATORY_CARE_PROVIDER_SITE_OTHER): Payer: 59 | Admitting: Family Medicine

## 2020-07-16 ENCOUNTER — Other Ambulatory Visit: Payer: Self-pay

## 2020-07-16 ENCOUNTER — Encounter (INDEPENDENT_AMBULATORY_CARE_PROVIDER_SITE_OTHER): Payer: Self-pay | Admitting: Family Medicine

## 2020-07-16 ENCOUNTER — Ambulatory Visit (INDEPENDENT_AMBULATORY_CARE_PROVIDER_SITE_OTHER): Payer: 59 | Admitting: Family Medicine

## 2020-07-16 VITALS — BP 105/72 | HR 82 | Temp 98.8°F | Ht 66.0 in | Wt 195.0 lb

## 2020-07-16 DIAGNOSIS — E282 Polycystic ovarian syndrome: Secondary | ICD-10-CM | POA: Diagnosis not present

## 2020-07-16 DIAGNOSIS — R7303 Prediabetes: Secondary | ICD-10-CM | POA: Diagnosis not present

## 2020-07-16 DIAGNOSIS — E559 Vitamin D deficiency, unspecified: Secondary | ICD-10-CM

## 2020-07-16 DIAGNOSIS — Z6831 Body mass index (BMI) 31.0-31.9, adult: Secondary | ICD-10-CM

## 2020-07-16 DIAGNOSIS — E669 Obesity, unspecified: Secondary | ICD-10-CM

## 2020-07-20 NOTE — Progress Notes (Signed)
Chief Complaint:   OBESITY Paige Stout is here to discuss her progress with her obesity treatment plan along with follow-up of her obesity related diagnoses. Paige Stout is on the Category 2 Plan and states she is following her eating plan approximately 40% of the time. Paige Stout states she is walking and doing aerobics for 30 minutes 3 times per week.  Today's visit was #: 5 Starting weight: 199 lbs Starting date: 04/14/2020 Today's weight: 195 lbs Today's date: 07/16/2020 Total lbs lost to date: 4 lbs Total lbs lost since last in-office visit: 2 lbs  Interim History: Paige Stout says she has been doing more night eating.  She says she has a "need for salt".  She reports that she has endometriosis that is causing increased pain.  She would like to conceive and is currently trying.  Her GYN is Dr. Garwin Brothers.   Subjective:   1. Prediabetes Paige Stout has a diagnosis of prediabetes based on her elevated HgA1c and was informed this puts her at greater risk of developing diabetes. She continues to work on diet and exercise to decrease her risk of diabetes. She denies nausea or hypoglycemia.  Lab Results  Component Value Date   HGBA1C 5.4 04/14/2020   Lab Results  Component Value Date   INSULIN 18.2 04/14/2020   2. PCOS (polycystic ovarian syndrome) Paige Stout has PCOS and is taking metformin 500 mg daily.   3. Vitamin D deficiency Paige Stout's Vitamin D level was 16.2 on 04/14/2020. She is currently taking prescription vitamin D 50,000 IU each week. She denies nausea, vomiting or muscle weakness.  Assessment/Plan:   1. Prediabetes Paige Stout will continue to work on weight loss, exercise, and decreasing simple carbohydrates to help decrease the risk of diabetes.   2. PCOS (polycystic ovarian syndrome) Intensive lifestyle modifications are first line treatment for this issue. We discussed several lifestyle modifications today and she will continue to work on diet, exercise and weight loss efforts. Orders and  follow up as documented in patient record.  Counseling . PCOS is a leading cause of menstrual irregularities and infertility. It is also associated with obesity, hirsutism (excessive hair growth on the face, chest, or back), and cardiovascular risk factors such as high cholesterol and insulin resistance. . Insulin resistance appears to play a central role.  . Women with PCOS have been shown to have impaired appetite-regulating hormones. . Metformin is one medication that can improve metabolic parameters.  . Women with polycystic ovary syndrome (PCOS) have an increased risk for cardiovascular disease (CVD) - European Journal of Preventive Cardiology.  3. Vitamin D deficiency Low Vitamin D level contributes to fatigue and are associated with obesity, breast, and colon cancer. She agrees to continue to take prescription Vitamin D @50 ,000 IU every week and will follow-up for routine testing of Vitamin D, at least 2-3 times per year to avoid over-replacement.  4. Class 1 obesity with serious comorbidity and body mass index (BMI) of 31.0 to 31.9 in adult, unspecified obesity type Paige Stout is currently in the action stage of change. As such, her goal is to continue with weight loss efforts. She has agreed to the Category 2 Plan.   Exercise goals: For substantial health benefits, adults should do at least 150 minutes (2 hours and 30 minutes) a week of moderate-intensity, or 75 minutes (1 hour and 15 minutes) a week of vigorous-intensity aerobic physical activity, or an equivalent combination of moderate- and vigorous-intensity aerobic activity. Aerobic activity should be performed in episodes of at least 10 minutes,  and preferably, it should be spread throughout the week.  Behavioral modification strategies: increasing lean protein intake and decreasing simple carbohydrates.  Paige Stout has agreed to follow-up with our clinic in 2-3 weeks. She was informed of the importance of frequent follow-up visits to  maximize her success with intensive lifestyle modifications for her multiple health conditions.   Objective:   Blood pressure 105/72, pulse 82, temperature 98.8 F (37.1 C), temperature source Oral, height 5\' 6"  (1.676 m), weight 195 lb (88.5 kg), SpO2 98 %. Body mass index is 31.47 kg/m.  General: Cooperative, alert, well developed, in no acute distress. HEENT: Conjunctivae and lids unremarkable. Cardiovascular: Regular rhythm.  Lungs: Normal work of breathing. Neurologic: No focal deficits.   Lab Results  Component Value Date   CREATININE 0.68 04/14/2020   BUN 9 04/14/2020   NA 138 04/14/2020   K 4.3 04/14/2020   CL 101 04/14/2020   CO2 25 04/14/2020   Lab Results  Component Value Date   ALT 11 04/14/2020   AST 16 04/14/2020   ALKPHOS 84 04/14/2020   BILITOT 0.2 04/14/2020   Lab Results  Component Value Date   HGBA1C 5.4 04/14/2020   HGBA1C 5.6 08/07/2019   HGBA1C 5.5 10/02/2018   Lab Results  Component Value Date   INSULIN 18.2 04/14/2020   Lab Results  Component Value Date   TSH 1.980 04/14/2020   Lab Results  Component Value Date   CHOL 188 04/14/2020   HDL 69 04/14/2020   LDLCALC 95 04/14/2020   TRIG 140 04/14/2020   CHOLHDL 3 08/07/2019   Lab Results  Component Value Date   WBC 10.7 04/14/2020   HGB 13.5 04/14/2020   HCT 40.4 04/14/2020   MCV 90 04/14/2020   PLT 367 04/14/2020   Attestation Statements:   Reviewed by clinician on day of visit: allergies, medications, problem list, medical history, surgical history, family history, social history, and previous encounter notes.  Time spent on visit including pre-visit chart review and post-visit care and charting was 25 minutes.   I, Water quality scientist, CMA, am acting as transcriptionist for Briscoe Deutscher, DO  I have reviewed the above documentation for accuracy and completeness, and I agree with the above. Briscoe Deutscher, DO

## 2020-08-07 ENCOUNTER — Encounter: Payer: 59 | Admitting: Physician Assistant

## 2020-08-11 ENCOUNTER — Encounter (INDEPENDENT_AMBULATORY_CARE_PROVIDER_SITE_OTHER): Payer: Self-pay | Admitting: Family Medicine

## 2020-08-11 ENCOUNTER — Other Ambulatory Visit: Payer: Self-pay

## 2020-08-11 ENCOUNTER — Ambulatory Visit (INDEPENDENT_AMBULATORY_CARE_PROVIDER_SITE_OTHER): Payer: 59 | Admitting: Family Medicine

## 2020-08-11 VITALS — BP 118/74 | HR 73 | Temp 98.4°F | Ht 66.0 in | Wt 198.0 lb

## 2020-08-11 DIAGNOSIS — G4709 Other insomnia: Secondary | ICD-10-CM

## 2020-08-11 DIAGNOSIS — R7303 Prediabetes: Secondary | ICD-10-CM

## 2020-08-11 DIAGNOSIS — E669 Obesity, unspecified: Secondary | ICD-10-CM

## 2020-08-11 DIAGNOSIS — R948 Abnormal results of function studies of other organs and systems: Secondary | ICD-10-CM | POA: Diagnosis not present

## 2020-08-11 DIAGNOSIS — Z6832 Body mass index (BMI) 32.0-32.9, adult: Secondary | ICD-10-CM

## 2020-08-11 MED ORDER — PHENTERMINE HCL 37.5 MG PO TABS: 18.2500 mg | ORAL_TABLET | Freq: Every day | ORAL | 0 refills | Status: DC

## 2020-08-11 MED ORDER — TRAZODONE HCL 50 MG PO TABS: 25.0000 mg | ORAL_TABLET | Freq: Every day | ORAL | 0 refills | Status: DC

## 2020-08-11 MED ORDER — METFORMIN HCL 500 MG PO TABS: 500.0000 mg | ORAL_TABLET | Freq: Two times a day (BID) | ORAL | 0 refills | Status: DC

## 2020-08-13 NOTE — Progress Notes (Signed)
Chief Complaint:   OBESITY Paige Stout is here to discuss her progress with her obesity treatment plan along with follow-up of her obesity related diagnoses. Paige Stout is on the Category 2 Plan and states she is following her eating plan approximately 30% of the time. Paige Stout states she is walking for 30 minutes 2 times per week and increasing her activity.  Today's visit was #: 6 Starting weight: 199 lbs Starting date: 04/14/2020 Today's weight: 198 lbs Today's date: 08/11/2020 Total lbs lost to date: 1 Total lbs lost since last in-office visit: 0  Interim History:  Today's bioimpedance results indicate that Paige Stout has gained 1 pound of water weight since her last visit.  She is frustrated with the lack of weight loss.  Metabolism is slower than expected.  She says she has a hard time getting in all of her protein.  Her sleep is poor.  Assessment/Plan:   1. Other insomnia The problem of recurrent insomnia was discussed. We discussed several lifestyle modifications today and she will continue to work on diet, exercise and weight loss efforts. After discussion, patient would like to start below medication. Expectations, risks, and potential side effects reviewed. This issue directly impacts care plan for optimization of BMI and metabolic health as it impacts the patient's ability to make lifestyle changes.  -Start traZODone (DESYREL) 50 MG tablet; Take 0.5 tablets (25 mg total) by mouth at bedtime.  Dispense: 15 tablet; Refill: 0  2. Prediabetes Goal is HgbA1c < 5.7 and insulin level closer to 5.  Will increase metformin to twice daily dosing, as per below.  Lab Results  Component Value Date   HGBA1C 5.4 04/14/2020   Lab Results  Component Value Date   INSULIN 18.2 04/14/2020   -Increase metFORMIN (GLUCOPHAGE) 500 MG tablet; Take 1 tablet (500 mg total) by mouth 2 (two) times daily with a meal.  Dispense: 60 tablet; Refill: 0  3. Abnormal metabolism Paige Stout's metabolism is very low  compared to expected. We reviewed ways to increase metabolism, including increasing protein intake, increasing exercise (especially strength training), and getting restorative sleep. While working on these goals, we discussed the option to use phentermine.   This patient 1) has no evidence of serious cardiovascular disease; 2) does not have serious psychiatric disease or a history of substance abuse; 3) has been informed about weight loss medications that are FDA-approved for long term use. Patient understands that all anti-obesity medications are contraindicated in pregnancy. Patient understands that long-term use of phentermine is considered off-label use of this medication, however, that the Endocrine Society and recent research supports that long-term use of phentermine does not appear to have detrimental health effects if it is used in an appropriate patient.  We reviewed potential side effects including insomnia, dry mouth, increased heart rate and blood pressure, and increased anxiety. We reviewed reducing caffeine consumption while taking phentermine, especially if the patient is experiencing side effects. All questions were answered, and the patient wishes to move forward with this medication  -Start phentermine (ADIPEX-P) 37.5 MG tablet; Take 0.5 tablets (18.75 mg total) by mouth daily before breakfast.  Dispense: 15 tablet; Refill: 0  I have consulted the [place] Controlled Substances Registry for this patient, and feel the risk/benefit ratio today is favorable for proceeding with this prescription for a controlled substance. The patient understands monitoring parameters and red flags.   4. Class 1 obesity with serious comorbidity and body mass index (BMI) of 32.0 to 32.9 in adult, unspecified obesity type Paige Stout  is currently in the action stage of change. As such, her goal is to continue with weight loss efforts. She has agreed to keeping a food journal and adhering to recommended goals of  1000-1200 calories and 85+ grams of protein.   Exercise goals: For substantial health benefits, adults should do at least 150 minutes (2 hours and 30 minutes) a week of moderate-intensity, or 75 minutes (1 hour and 15 minutes) a week of vigorous-intensity aerobic physical activity, or an equivalent combination of moderate- and vigorous-intensity aerobic activity. Aerobic activity should be performed in episodes of at least 10 minutes, and preferably, it should be spread throughout the week.  Behavioral modification strategies: increasing lean protein intake and meal planning and cooking strategies.  Paige Stout has agreed to follow-up with our clinic in 2 weeks. She was informed of the importance of frequent follow-up visits to maximize her success with intensive lifestyle modifications for her multiple health conditions.   Objective:   Blood pressure 118/74, pulse 73, temperature 98.4 F (36.9 C), temperature source Oral, height 5\' 6"  (1.676 m), weight 198 lb (89.8 kg), SpO2 98 %. Body mass index is 31.96 kg/m.  General: Cooperative, alert, well developed, in no acute distress. HEENT: Conjunctivae and lids unremarkable. Cardiovascular: Regular rhythm.  Lungs: Normal work of breathing. Neurologic: No focal deficits.   Lab Results  Component Value Date   CREATININE 0.68 04/14/2020   BUN 9 04/14/2020   NA 138 04/14/2020   K 4.3 04/14/2020   CL 101 04/14/2020   CO2 25 04/14/2020   Lab Results  Component Value Date   ALT 11 04/14/2020   AST 16 04/14/2020   ALKPHOS 84 04/14/2020   BILITOT 0.2 04/14/2020   Lab Results  Component Value Date   HGBA1C 5.4 04/14/2020   HGBA1C 5.6 08/07/2019   HGBA1C 5.5 10/02/2018   Lab Results  Component Value Date   INSULIN 18.2 04/14/2020   Lab Results  Component Value Date   TSH 1.980 04/14/2020   Lab Results  Component Value Date   CHOL 188 04/14/2020   HDL 69 04/14/2020   LDLCALC 95 04/14/2020   TRIG 140 04/14/2020   CHOLHDL 3  08/07/2019   Lab Results  Component Value Date   WBC 10.7 04/14/2020   HGB 13.5 04/14/2020   HCT 40.4 04/14/2020   MCV 90 04/14/2020   PLT 367 04/14/2020   Attestation Statements:   Reviewed by clinician on day of visit: allergies, medications, problem list, medical history, surgical history, family history, social history, and previous encounter notes.  I, Water quality scientist, CMA, am acting as transcriptionist for Briscoe Deutscher, DO  I have reviewed the above documentation for accuracy and completeness, and I agree with the above. Briscoe Deutscher, DO

## 2020-08-25 ENCOUNTER — Ambulatory Visit (INDEPENDENT_AMBULATORY_CARE_PROVIDER_SITE_OTHER): Payer: 59 | Admitting: Family Medicine

## 2020-09-15 ENCOUNTER — Encounter: Payer: 59 | Admitting: Physician Assistant

## 2021-08-25 ENCOUNTER — Ambulatory Visit: Payer: 59 | Admitting: Family Medicine

## 2021-08-25 ENCOUNTER — Other Ambulatory Visit: Payer: Self-pay

## 2021-08-25 ENCOUNTER — Encounter: Payer: Self-pay | Admitting: Family Medicine

## 2021-08-25 VITALS — BP 126/72 | HR 77 | Temp 98.2°F | Resp 16 | Ht 66.0 in | Wt 203.4 lb

## 2021-08-25 DIAGNOSIS — L910 Hypertrophic scar: Secondary | ICD-10-CM | POA: Diagnosis not present

## 2021-08-25 DIAGNOSIS — S31109A Unspecified open wound of abdominal wall, unspecified quadrant without penetration into peritoneal cavity, initial encounter: Secondary | ICD-10-CM | POA: Diagnosis not present

## 2021-08-25 MED ORDER — DOXYCYCLINE HYCLATE 100 MG PO TABS: 100.0000 mg | ORAL_TABLET | Freq: Two times a day (BID) | ORAL | 0 refills | Status: DC

## 2021-08-25 NOTE — Patient Instructions (Signed)
Try applying warm compresses to the affected area at least 3-4 times a day, gentle pressure after warm compress to see if you are able to express any more pus/exudate.  Start antibiotic twice per day.  If it is not improving into next week, I would recommend dermatology follow-up.  If any worsening be seen by one of our providers or urgent care if needed.  Let me know if there are questions and take care.

## 2021-08-25 NOTE — Progress Notes (Signed)
Subjective:  Patient ID: Paige Stout, female    DOB: [birth year]  Age: 30 y.o. MRN: [id number]  CC:  Chief Complaint  Patient presents with   Wound Check    Pt reports ingrow hair on the pubic area that has opened and is now fleshy and would like this checked, pt reports blood from the area light spotting     HPI [place] presents for   Wound, pubic area: Initially sore area about 3-4 days ago. Applied tea tree oil, vaseline, looked like ingrown hair - used sterilized needle to try to lift hair - no relief, some pus and blood drainage 2 days ago. Min blood at this time. Wound overlying keloid area. Has decreased in size.  No fever, feels well otherwise.  Tx: tea tree oil, initial warm compresses, not recent.  History Patient Active Problem List   Diagnosis Date Noted   Vitamin D deficiency 04/28/2020   Insulin resistance 04/28/2020   Primary insomnia 08/10/2019   Menorrhagia 10/04/2018   At risk for infertility 10/04/2018   Class 1 obesity with serious comorbidity and body mass index (BMI) of 31.0 to 31.9 in adult 10/04/2018   PCOS (polycystic ovarian syndrome) suspected 10/04/2018   Pelvic pain 09/29/2018   Uterine leiomyoma 09/29/2018   Dysmenorrhea 09/19/2018   Past Medical History:  Diagnosis Date   Constipation    Dysmenorrhea 09/19/2018   Female infertility    Headache    Joint pain    Menorrhagia 10/04/2018   PCOS (polycystic ovarian syndrome)    Shingles 2013   Uterine leiomyoma 09/29/2018   Vitamin B 12 deficiency    Vitamin D deficiency    Past Surgical History:  Procedure Laterality Date   LAPAROSCOPY N/A 11/12/2018   Procedure: LAPAROSCOPY OPERATIVE, Peritoneal Biopsies;  Surgeon: Eldred Manges, MD;  Location: [facility];  Service: Gynecology;  Laterality: N/A;   No Known Allergies Prior to Admission medications   Medication Sig Start Date End Date Taking? Authorizing Provider  ascorbic acid (VITAMIN C) 500 MG tablet Take 500 mg by  mouth daily.   Yes [provider]  ELDERBERRY PO Take by mouth.   Yes [provider]  zinc gluconate 50 MG tablet Take 50 mg by mouth daily.   Yes [provider]  metFORMIN (GLUCOPHAGE) 500 MG tablet Take 1 tablet (500 mg total) by mouth 2 (two) times daily with a meal. Patient not taking: Reported on 08/25/2021 08/11/20   Briscoe Deutscher, DO  phentermine (ADIPEX-P) 37.5 MG tablet Take 0.5 tablets (18.75 mg total) by mouth daily before breakfast. Patient not taking: Reported on 08/25/2021 08/11/20   Briscoe Deutscher, DO  traZODone (DESYREL) 50 MG tablet Take 0.5 tablets (25 mg total) by mouth at bedtime. Patient not taking: Reported on 08/25/2021 08/11/20   Briscoe Deutscher, DO  Vitamin D, Ergocalciferol, (DRISDOL) 1.25 MG (50000 UNIT) CAPS capsule Take 1 capsule (50,000 Units total) by mouth every 7 (seven) days. Patient not taking: Reported on 08/25/2021 06/17/20   Starlyn Skeans, MD   Social History   Socioeconomic History   Marital status: Married    Spouse name: Meleah Demeyer   Number of children: 0   Years of education: Not on file   Highest education level: Not on file  Occupational History   Not on file  Tobacco Use   Smoking status: Never   Smokeless tobacco: Never  Vaping Use   Vaping Use: Never used  Substance and Sexual Activity  Alcohol use: No   Drug use: No   Sexual activity: Yes    Birth control/protection: None  Other Topics Concern   Not on file  Social History Narrative   Not on file   Social Determinants of Health   Financial Resource Strain: Not on file  Food Insecurity: Not on file  Transportation Needs: Not on file  Physical Activity: Not on file  Stress: Not on file  Social Connections: Not on file  Intimate Partner Violence: Not on file    Review of Systems Per HPI  Objective:   Vitals:   08/25/21 1603  BP: 126/72  Pulse: 77  Resp: 16  Temp: 98.2 F (36.8 C)  TempSrc: Temporal  SpO2: 97%  Weight: 203 lb 6.4  oz (92.3 kg)  Height: 5\' 6"  (1.676 m)     Physical Exam Exam conducted with a chaperone present.  Constitutional:      General: She is not in acute distress.    Appearance: Normal appearance. She is well-developed.  HENT:     Head: Normocephalic and atraumatic.  Cardiovascular:     Rate and Rhythm: Normal rate.  Pulmonary:     Effort: Pulmonary effort is normal.  Skin:    Comments: Patch of what appears to be some granulation tissue, slight raw appearance, elevated, at area of keloid, lower abdominal wall.  No significant surrounding induration or fluctuance was appreciated or discharge/bleeding with pressure.  No significant surrounding erythema appreciated.  No vascular streaks.  See photo  Neurological:     Mental Status: She is alert and oriented to person, place, and time.  Psychiatric:        Mood and Affect: Mood normal.        Assessment & Plan:  Paige Stout is a 30 y.o. female . Wound of groin - Plan: doxycycline (VIBRA-TABS) 100 MG tablet  Keloid - Plan: doxycycline (VIBRA-TABS) 100 MG tablet Possible ingrown hair with abscess that has now self drained, no appreciable abscess noted at this time.  Possible granulation tissue with area of keloid.  Does not have apparent indication for incision and drainage at this time, continued warm compresses, gentle pressure, start doxycycline -risks and side effects discussed, RTC precautions given as well as potential need for dermatology referral depending on how area improves into next week.  Meds ordered this encounter  Medications   doxycycline (VIBRA-TABS) 100 MG tablet    Sig: Take 1 tablet (100 mg total) by mouth 2 (two) times daily.    Dispense:  14 tablet    Refill:  0   Patient Instructions  Try applying warm compresses to the affected area at least 3-4 times a day, gentle pressure after warm compress to see if you are able to express any more pus/exudate.  Start antibiotic twice per day.  If it is not  improving into next week, I would recommend dermatology follow-up.  If any worsening be seen by one of our providers or urgent care if needed.  Let me know if there are questions and take care.      Signed,   Merri Ray, MD [place] 08/26/21 6:13 PM

## 2021-11-23 ENCOUNTER — Other Ambulatory Visit: Payer: Self-pay | Admitting: Obstetrics and Gynecology

## 2021-11-23 DIAGNOSIS — N979 Female infertility, unspecified: Secondary | ICD-10-CM

## 2022-01-13 ENCOUNTER — Encounter: Payer: Self-pay | Admitting: Family Medicine

## 2022-01-13 ENCOUNTER — Ambulatory Visit: Payer: 59 | Admitting: Family Medicine

## 2022-01-13 VITALS — BP 110/80 | HR 90 | Temp 97.9°F | Resp 16 | Ht 66.0 in | Wt 208.2 lb

## 2022-01-13 DIAGNOSIS — R062 Wheezing: Secondary | ICD-10-CM

## 2022-01-13 DIAGNOSIS — R058 Other specified cough: Secondary | ICD-10-CM

## 2022-01-13 MED ORDER — BENZONATATE 100 MG PO CAPS: 100.0000 mg | ORAL_CAPSULE | Freq: Three times a day (TID) | ORAL | 0 refills | Status: DC | PRN

## 2022-01-13 MED ORDER — FLUTICASONE PROPIONATE HFA 44 MCG/ACT IN AERO: 1.0000 | INHALATION_SPRAY | Freq: Two times a day (BID) | RESPIRATORY_TRACT | 1 refills | Status: DC

## 2022-01-13 NOTE — Patient Instructions (Addendum)
Tessalon Perles 3 times per day as needed.  X-ray ordered at the [facility] location.  Can try the inhaled steroid 1 puff twice per day in case this may be a cough variant asthma.  I would also like you to try Pepcid over-the-counter and Flonase over-the-counter to see if cough is from upper airway irritation from either silent heartburn or postnasal drip.  Recheck with me in 3 weeks, sooner if worse or if there are questions.  Good luck.    Cough, Adult Coughing is a reflex that clears your throat and your airways (respiratory system). Coughing helps to heal and protect your lungs. It is normal to cough occasionally, but a cough that happens with other symptoms or lasts a long time may be a sign of a condition that needs treatment. An acute cough may only last 2-3 weeks, while a chronic cough may last 8 or more weeks. Coughing is commonly caused by: Infection of the respiratory systemby viruses or bacteria. Breathing in substances that irritate your lungs. Allergies. Asthma. Mucus that runs down the back of your throat (postnasal drip). Smoking. Acid backing up from the stomach into the esophagus (gastroesophageal reflux). Certain medicines. Chronic lung problems. Other medical conditions such as heart failure or a blood clot in the lung (pulmonary embolism). Follow these instructions at home: Medicines Take over-the-counter and prescription medicines only as told by your health care provider. Talk with your health care provider before you take a cough suppressant medicine. Lifestyle  Avoid cigarette smoke. Do not use any products that contain nicotine or tobacco, such as cigarettes, e-cigarettes, and chewing tobacco. If you need help quitting, ask your health care provider. Drink enough fluid to keep your urine pale yellow. Avoid caffeine. Do not drink alcohol if your health care provider tells you not to drink. General instructions  Pay close attention to changes in your cough. Tell  your health care provider about them. Always cover your mouth when you cough. Avoid things that make you cough, such as perfume, candles, cleaning products, or campfire or tobacco smoke. If the air is dry, use a cool mist vaporizer or humidifier in your bedroom or your home to help loosen secretions. If your cough is worse at night, try to sleep in a semi-upright position. Rest as needed. Keep all follow-up visits as told by your health care provider. This is important. Contact a health care provider if you: Have new symptoms. Cough up pus. Have a cough that does not get better after 2-3 weeks or gets worse. Cannot control your cough with cough suppressant medicines and you are losing sleep. Have pain that gets worse or pain that is not helped with medicine. Have a fever. Have unexplained weight loss. Have night sweats. Get help right away if: You cough up blood. You have difficulty breathing. Your heartbeat is very fast. These symptoms may represent a serious problem that is an emergency. Do not wait to see if the symptoms will go away. Get medical help right away. Call your local emergency services (911 in the [place] Do not drive yourself to the hospital. Summary Coughing is a reflex that clears your throat and your airways. It is normal to cough occasionally, but a cough that happens with other symptoms or lasts a long time may be a sign of a condition that needs treatment. Take over-the-counter and prescription medicines only as told by your health care provider. Always cover your mouth when you cough. Contact a health care provider if you have  new symptoms or a cough that does not get better after 2-3 weeks or gets worse. This information is not intended to replace advice given to you by your health care provider. Make sure you discuss any questions you have with your health care provider. Document Revised: 12/03/2018 Document Reviewed: 12/03/2018 Elsevier Patient Education  [employer].

## 2022-01-13 NOTE — Progress Notes (Signed)
Subjective:  Patient ID: Paige Stout, female    DOB: 10/28/1991  Age: 31 y.o. MRN: [id number]  CC:  Chief Complaint  Patient presents with   Cough    Has had cough since November, has been off and on, had a covid, flu, and RSV and all came back negative. No other sxs     HPI Paige Stout presents for   Cough: Noted at end of October.  [place], vit C,  cough improved in few weeks, returned around Christmas.  Husband sick with congestion, cough soon after. Covid, flu , rsv testing negative early January.  Cough off and on since.  Cough seems worse around time of menses. Hx of endometriosis, followed by Dr. Charlesetta Garibaldi - question of thoracic endometriosis.  Persistent cough past 2 weeks. LMP started 12/29/21.  Dry cough. No wt loss or night sweats, no fever.  Worse at night, or with prolonged talking.  Initial postnasal drip few months ago, less now.  No hx of GERD or heartburn.  No dyspnea, possible slight wheeze with laugh. No hx of asthma, seasonal allergies.   Tx: dayquil, nyquil - no relief.   History Patient Active Problem List   Diagnosis Date Noted   Vitamin D deficiency 04/28/2020   Insulin resistance 04/28/2020   Primary insomnia 08/10/2019   Menorrhagia 10/04/2018   At risk for infertility 10/04/2018   Class 1 obesity with serious comorbidity and body mass index (BMI) of 31.0 to 31.9 in adult 10/04/2018   PCOS (polycystic ovarian syndrome) suspected 10/04/2018   Pelvic pain 09/29/2018   Uterine leiomyoma 09/29/2018   Dysmenorrhea 09/19/2018   Past Medical History:  Diagnosis Date   Constipation    Dysmenorrhea 09/19/2018   Female infertility    Headache    Joint pain    Menorrhagia 10/04/2018   PCOS (polycystic ovarian syndrome)    Shingles 2013   Uterine leiomyoma 09/29/2018   Vitamin B 12 deficiency    Vitamin D deficiency    Past Surgical History:  Procedure Laterality Date   LAPAROSCOPY N/A 11/12/2018   Procedure: LAPAROSCOPY OPERATIVE,  Peritoneal Biopsies;  Surgeon: Eldred Manges, MD;  Location: [facility];  Service: Gynecology;  Laterality: N/A;   No Known Allergies Prior to Admission medications   Medication Sig Start Date End Date Taking? Authorizing Provider  ascorbic acid (VITAMIN C) 500 MG tablet Take 500 mg by mouth daily.   Yes [provider]  ELDERBERRY PO Take by mouth.   Yes [provider]  Prenatal Vit-Fe Fumarate-FA (PRENATAL MULTIVITAMIN) TABS tablet Take 1 tablet by mouth daily at 12 noon.   Yes [provider]  doxycycline (VIBRA-TABS) 100 MG tablet Take 1 tablet (100 mg total) by mouth 2 (two) times daily. Patient not taking: Reported on 01/13/2022 08/25/21   Wendie Agreste, MD  metFORMIN (GLUCOPHAGE) 500 MG tablet Take 1 tablet (500 mg total) by mouth 2 (two) times daily with a meal. Patient not taking: Reported on 08/25/2021 08/11/20   Briscoe Deutscher, DO  phentermine (ADIPEX-P) 37.5 MG tablet Take 0.5 tablets (18.75 mg total) by mouth daily before breakfast. Patient not taking: Reported on 08/25/2021 08/11/20   Briscoe Deutscher, DO  traZODone (DESYREL) 50 MG tablet Take 0.5 tablets (25 mg total) by mouth at bedtime. Patient not taking: Reported on 08/25/2021 08/11/20   Briscoe Deutscher, DO  Vitamin D, Ergocalciferol, (DRISDOL) 1.25 MG (50000 UNIT) CAPS capsule Take 1 capsule (50,000 Units total) by mouth every 7 (seven) days. Patient  not taking: Reported on 08/25/2021 06/17/20   Dennard Nip D, MD  zinc gluconate 50 MG tablet Take 50 mg by mouth daily. Patient not taking: Reported on 01/13/2022    [provider]   Social History   Socioeconomic History   Marital status: Married    Spouse name: Opie Fanton   Number of children: 0   Years of education: Not on file   Highest education level: Not on file  Occupational History   Not on file  Tobacco Use   Smoking status: Never   Smokeless tobacco: Never  Vaping Use   Vaping Use: Never used  Substance and Sexual  Activity   Alcohol use: No   Drug use: No   Sexual activity: Yes    Birth control/protection: None  Other Topics Concern   Not on file  Social History Narrative   Not on file   Social Determinants of Health   Financial Resource Strain: Not on file  Food Insecurity: Not on file  Transportation Needs: Not on file  Physical Activity: Not on file  Stress: Not on file  Social Connections: Not on file  Intimate Partner Violence: Not on file    Review of Systems Per HPI.   Objective:   Vitals:   01/13/22 1134  BP: 110/80  Pulse: 90  Resp: 16  Temp: 97.9 F (36.6 C)  TempSrc: Temporal  SpO2: 95%  Weight: 208 lb 3.2 oz (94.4 kg)  Height: 5\' 6"  (1.676 m)     Physical Exam Vitals reviewed.  Constitutional:      General: She is not in acute distress.    Appearance: She is well-developed.  HENT:     Head: Normocephalic and atraumatic.     Right Ear: Hearing, tympanic membrane, ear canal and external ear normal.     Left Ear: Hearing, tympanic membrane, ear canal and external ear normal.     Nose: Nose normal.     Mouth/Throat:     Pharynx: No posterior oropharyngeal erythema.  Eyes:     Conjunctiva/sclera: Conjunctivae normal.     Pupils: Pupils are equal, round, and reactive to light.  Cardiovascular:     Rate and Rhythm: Normal rate and regular rhythm.     Heart sounds: Normal heart sounds. No murmur heard. Pulmonary:     Effort: Pulmonary effort is normal. No respiratory distress.     Breath sounds: Normal breath sounds. No stridor. No wheezing, rhonchi or rales.     Comments: Dry cough during visit.  Skin:    General: Skin is warm and dry.     Findings: No rash.  Neurological:     Mental Status: She is alert and oriented to person, place, and time.  Psychiatric:        Mood and Affect: Mood normal.        Behavior: Behavior normal.       Assessment & Plan:  Paige Stout is a 31 y.o. female . Recurrent dry cough - Plan: benzonatate (TESSALON)  100 MG capsule, fluticasone (FLOVENT HFA) 44 MCG/ACT inhaler, DG Chest 2 View  Wheezing - Plan: fluticasone (FLOVENT HFA) 44 MCG/ACT inhaler Recurrent, persistent dry cough.  Possible initial postinfectious cough, differential includes cough variant asthma, upper airway cough syndrome.  She did have concern regarding the temporal nature of her cough and menses and question of thoracic endometriosis.  We will start with chest x-ray, Flonase over-the-counter, Pepcid over-the-counter for reflux or postnasal drip as possible upper airway cough  syndrome.  Flovent HFA for possible cough variant asthma.  Tessalon Perles if needed.  Other symptomatic care discussed with handout given and recheck in 3 weeks.  Meds ordered this encounter  Medications   benzonatate (TESSALON) 100 MG capsule    Sig: Take 1 capsule (100 mg total) by mouth 3 (three) times daily as needed for cough.    Dispense:  20 capsule    Refill:  0   fluticasone (FLOVENT HFA) 44 MCG/ACT inhaler    Sig: Inhale 1 puff into the lungs 2 (two) times daily.    Dispense:  1 each    Refill:  1   Patient Instructions  Tessalon Perles 3 times per day as needed.  X-ray ordered at the [facility] location.  Can try the inhaled steroid 1 puff twice per day in case this may be a cough variant asthma.  I would also like you to try Pepcid over-the-counter and Flonase over-the-counter to see if cough is from upper airway irritation from either silent heartburn or postnasal drip.  Recheck with me in 3 weeks, sooner if worse or if there are questions.  Good luck.    Cough, Adult Coughing is a reflex that clears your throat and your airways (respiratory system). Coughing helps to heal and protect your lungs. It is normal to cough occasionally, but a cough that happens with other symptoms or lasts a long time may be a sign of a condition that needs treatment. An acute cough may only last 2-3 weeks, while a chronic cough may last 8 or more weeks. Coughing  is commonly caused by: Infection of the respiratory systemby viruses or bacteria. Breathing in substances that irritate your lungs. Allergies. Asthma. Mucus that runs down the back of your throat (postnasal drip). Smoking. Acid backing up from the stomach into the esophagus (gastroesophageal reflux). Certain medicines. Chronic lung problems. Other medical conditions such as heart failure or a blood clot in the lung (pulmonary embolism). Follow these instructions at home: Medicines Take over-the-counter and prescription medicines only as told by your health care provider. Talk with your health care provider before you take a cough suppressant medicine. Lifestyle  Avoid cigarette smoke. Do not use any products that contain nicotine or tobacco, such as cigarettes, e-cigarettes, and chewing tobacco. If you need help quitting, ask your health care provider. Drink enough fluid to keep your urine pale yellow. Avoid caffeine. Do not drink alcohol if your health care provider tells you not to drink. General instructions  Pay close attention to changes in your cough. Tell your health care provider about them. Always cover your mouth when you cough. Avoid things that make you cough, such as perfume, candles, cleaning products, or campfire or tobacco smoke. If the air is dry, use a cool mist vaporizer or humidifier in your bedroom or your home to help loosen secretions. If your cough is worse at night, try to sleep in a semi-upright position. Rest as needed. Keep all follow-up visits as told by your health care provider. This is important. Contact a health care provider if you: Have new symptoms. Cough up pus. Have a cough that does not get better after 2-3 weeks or gets worse. Cannot control your cough with cough suppressant medicines and you are losing sleep. Have pain that gets worse or pain that is not helped with medicine. Have a fever. Have unexplained weight loss. Have night  sweats. Get help right away if: You cough up blood. You have difficulty breathing. Your heartbeat  is very fast. These symptoms may represent a serious problem that is an emergency. Do not wait to see if the symptoms will go away. Get medical help right away. Call your local emergency services (911 in the [place] Do not drive yourself to the hospital. Summary Coughing is a reflex that clears your throat and your airways. It is normal to cough occasionally, but a cough that happens with other symptoms or lasts a long time may be a sign of a condition that needs treatment. Take over-the-counter and prescription medicines only as told by your health care provider. Always cover your mouth when you cough. Contact a health care provider if you have new symptoms or a cough that does not get better after 2-3 weeks or gets worse. This information is not intended to replace advice given to you by your health care provider. Make sure you discuss any questions you have with your health care provider. Document Revised: 12/03/2018 Document Reviewed: 12/03/2018 Elsevier Patient Education  2022 [place],   Merri Ray, MD [place] 01/13/22 12:26 PM

## 2022-01-15 LAB — HM PAP SMEAR: HM Pap smear: NEGATIVE

## 2022-01-21 ENCOUNTER — Other Ambulatory Visit: Payer: Self-pay

## 2022-01-21 ENCOUNTER — Ambulatory Visit (INDEPENDENT_AMBULATORY_CARE_PROVIDER_SITE_OTHER): Payer: 59

## 2022-01-21 ENCOUNTER — Telehealth: Payer: Self-pay

## 2022-01-21 DIAGNOSIS — R058 Other specified cough: Secondary | ICD-10-CM | POA: Diagnosis not present

## 2022-01-21 NOTE — Telephone Encounter (Signed)
Can make virtual for next week if open appointments

## 2022-01-21 NOTE — Telephone Encounter (Signed)
Caller name:Breelyn Covingston   On DPR? :Yes  Call back number:[phone]  Provider they see: Carlota Raspberry   Reason for call:Pt called she sees Dr Carlota Raspberry on the 9th and this cough is getting bad and worse and wants advise on what to do she does not see Dr Carlota Raspberry until the 9th

## 2022-01-24 NOTE — Telephone Encounter (Signed)
LM asking pt to call back to schedule an appt

## 2022-02-03 ENCOUNTER — Ambulatory Visit
Admission: RE | Admit: 2022-02-03 | Discharge: 2022-02-03 | Disposition: A | Discharge status: Home / Self Care | Payer: 59 | Source: Ambulatory Visit | Attending: Obstetrics and Gynecology | Admitting: Obstetrics and Gynecology

## 2022-02-03 DIAGNOSIS — N979 Female infertility, unspecified: Secondary | ICD-10-CM

## 2022-02-04 ENCOUNTER — Telehealth: Payer: 59 | Admitting: Family Medicine

## 2022-02-04 ENCOUNTER — Encounter: Payer: Self-pay | Admitting: Family Medicine

## 2022-02-04 ENCOUNTER — Telehealth: Payer: Self-pay | Admitting: Family Medicine

## 2022-02-04 VITALS — Wt 203.8 lb

## 2022-02-04 DIAGNOSIS — R053 Chronic cough: Secondary | ICD-10-CM

## 2022-02-04 MED ORDER — HYDROCOD POLI-CHLORPHE POLI ER 10-8 MG/5ML PO SUER: 5.0000 mL | Freq: Two times a day (BID) | ORAL | 0 refills | Status: DC | PRN

## 2022-02-04 NOTE — Progress Notes (Signed)
Virtual Visit via Video Note  I connected with [place] on 02/04/22 at 11:52 AM by a video enabled telemedicine application and verified that I am speaking with the correct person using two identifiers.  Patient location:home by self My location: office - [place].    I discussed the limitations, risks, security and privacy concerns of performing an evaluation and management service by telephone and the availability of in person appointments. I also discussed with the patient that there may be a patient responsible charge related to this service. The patient expressed understanding and agreed to proceed, consent obtained  Chief complaint:  Chief Complaint  Patient presents with   Cough    Pt reports still persistent, and worst at night, pt reports cough has worsened with her menses and then when cycle stopped it was lessened again     History of Present Illness: Paige Stout is a 31 y.o. female  Cough Discussed February 16.  Cough off and on since October of last year with some interval improvement at times.  Concern discussed last visit of possible thoracic endometriosis and association of cough with menses.  Dry cough.  No history of GERD, heartburn, or recent postnasal drip.  Had been improving when discussed last visit.  Possible component of wheeze.  Differential included postinfectious cough, cough variant asthma, upper airway cough syndrome.  Treated with Flonase, Pepcid, Flovent HFA, Tessalon Perles and x-ray ordered last visit. Chest x-ray 01/21/2022 without focal consolidation, edema, effusion or pneumothorax.  Slight dextrocurvature of upper thoracic spine, no acute cardiopulmonary process.    Cough sporadic during day, more at night. Trouble sleeping Tessalon BID - ? Worse with tessalon. Dry cough, no fever.  Flonase caused burning feeling - used for 3 days, stopped as no benefit and irritation. Was sniffing after dose.  Did not try pepcid otc. No  heartburn. Flovent inhaler: 1.5 weeks, no change in symptoms. Stopped taking.  Cough worse during menses, better to baseline off menses.   Controlled substance database (PDMP) reviewed. No concerns appreciated.     Patient Active Problem List   Diagnosis Date Noted   Vitamin D deficiency 04/28/2020   Insulin resistance 04/28/2020   Primary insomnia 08/10/2019   Menorrhagia 10/04/2018   At risk for infertility 10/04/2018   Class 1 obesity with serious comorbidity and body mass index (BMI) of 31.0 to 31.9 in adult 10/04/2018   PCOS (polycystic ovarian syndrome) suspected 10/04/2018   Pelvic pain 09/29/2018   Uterine leiomyoma 09/29/2018   Dysmenorrhea 09/19/2018   Past Medical History:  Diagnosis Date   Constipation    Dysmenorrhea 09/19/2018   Female infertility    Headache    Joint pain    Menorrhagia 10/04/2018   PCOS (polycystic ovarian syndrome)    Shingles 2013   Uterine leiomyoma 09/29/2018   Vitamin B 12 deficiency    Vitamin D deficiency    Past Surgical History:  Procedure Laterality Date   LAPAROSCOPY N/A 11/12/2018   Procedure: LAPAROSCOPY OPERATIVE, Peritoneal Biopsies;  Surgeon: Eldred Manges, MD;  Location: [facility];  Service: Gynecology;  Laterality: N/A;   No Known Allergies Prior to Admission medications   Medication Sig Start Date End Date Taking? Authorizing Provider  ascorbic acid (VITAMIN C) 500 MG tablet Take 500 mg by mouth daily.   Yes [provider]  benzonatate (TESSALON) 100 MG capsule Take 1 capsule (100 mg total) by mouth 3 (three) times daily as needed for cough. 01/13/22  Yes Merri Ray  R, MD  ELDERBERRY PO Take by mouth.   Yes [provider]  fluticasone (FLOVENT HFA) 44 MCG/ACT inhaler Inhale 1 puff into the lungs 2 (two) times daily. 01/13/22  Yes Wendie Agreste, MD  Prenatal Vit-Fe Fumarate-FA (PRENATAL MULTIVITAMIN) TABS tablet Take 1 tablet by mouth daily at 12 noon.   Yes [provider]   doxycycline (VIBRA-TABS) 100 MG tablet Take 1 tablet (100 mg total) by mouth 2 (two) times daily. Patient not taking: Reported on 01/13/2022 08/25/21   Wendie Agreste, MD  metFORMIN (GLUCOPHAGE) 500 MG tablet Take 1 tablet (500 mg total) by mouth 2 (two) times daily with a meal. Patient not taking: Reported on 08/25/2021 08/11/20   Briscoe Deutscher, DO  phentermine (ADIPEX-P) 37.5 MG tablet Take 0.5 tablets (18.75 mg total) by mouth daily before breakfast. Patient not taking: Reported on 08/25/2021 08/11/20   Briscoe Deutscher, DO  traZODone (DESYREL) 50 MG tablet Take 0.5 tablets (25 mg total) by mouth at bedtime. Patient not taking: Reported on 08/25/2021 08/11/20   Briscoe Deutscher, DO  Vitamin D, Ergocalciferol, (DRISDOL) 1.25 MG (50000 UNIT) CAPS capsule Take 1 capsule (50,000 Units total) by mouth every 7 (seven) days. Patient not taking: Reported on 08/25/2021 06/17/20   Dennard Nip D, MD  zinc gluconate 50 MG tablet Take 50 mg by mouth daily. Patient not taking: Reported on 01/13/2022    [provider]   Social History   Socioeconomic History   Marital status: Married    Spouse name: Haydon Kalmar   Number of children: 0   Years of education: Not on file   Highest education level: Not on file  Occupational History   Not on file  Tobacco Use   Smoking status: Never   Smokeless tobacco: Never  Vaping Use   Vaping Use: Never used  Substance and Sexual Activity   Alcohol use: No   Drug use: No   Sexual activity: Yes    Birth control/protection: None  Other Topics Concern   Not on file  Social History Narrative   Not on file   Social Determinants of Health   Financial Resource Strain: Not on file  Food Insecurity: Not on file  Transportation Needs: Not on file  Physical Activity: Not on file  Stress: Not on file  Social Connections: Not on file  Intimate Partner Violence: Not on file    Observations/Objective: Vitals:   02/04/22 1057  Weight: 203 lb 12.8 oz  (92.4 kg)  Nonproductive cough heard multiple times during visit.  Speaking in full sentences without respiratory distress.  Nontoxic appearance on video.  Coherent responses.  Euthymic mood.  No audible wheeze.  All questions answered with understanding expressed.   Assessment and Plan: Chronic cough - Plan: Ambulatory referral to Pulmonology, chlorpheniramine-HYDROcodone (TUSSIONEX PENNKINETIC ER) 10-8 MG/5ML Persistent cough as above.  Still may be component of postinfectious cough, reactive airway, upper airway cough syndrome, postnasal drip, or cyclical cough at this point.  Did recommend restart steroid nasal spray with correct technique discussed to minimize side effects.  Recommended trial of at least 1 to 2 weeks to determine efficacy.  Recommended trying Pepcid, again for a few weeks.  Tussionex for nighttime cough suppression, potentially could use every 12 hours if persistent cough to treat possible cyclical cough.  Decided against steroids at this time.  Referred to pulmonary. Recommended update by MyChart next week, RTC precautions, ER precautions.  Follow Up Instructions:    Patient Instructions  Try taking  pepcid over the counter for 1-2 weeks to see if that helps.  Restart flonase, but avoid sniffing med after dosing.  I will refer you to pulmonary.  Tussionex cough syrup at night if needed.  Give me an update in next 1 week.  Return to the clinic or go to the nearest emergency room if any of your symptoms worsen or new symptoms occur.  Cough, Adult Coughing is a reflex that clears your throat and your airways (respiratory system). Coughing helps to heal and protect your lungs. It is normal to cough occasionally, but a cough that happens with other symptoms or lasts a long time may be a sign of a condition that needs treatment. An acute cough may only last 2-3 weeks, while a chronic cough may last 8 or more weeks. Coughing is commonly caused by: Infection of the respiratory  systemby viruses or bacteria. Breathing in substances that irritate your lungs. Allergies. Asthma. Mucus that runs down the back of your throat (postnasal drip). Smoking. Acid backing up from the stomach into the esophagus (gastroesophageal reflux). Certain medicines. Chronic lung problems. Other medical conditions such as heart failure or a blood clot in the lung (pulmonary embolism). Follow these instructions at home: Medicines Take over-the-counter and prescription medicines only as told by your health care provider. Talk with your health care provider before you take a cough suppressant medicine. Lifestyle  Avoid cigarette smoke. Do not use any products that contain nicotine or tobacco, such as cigarettes, e-cigarettes, and chewing tobacco. If you need help quitting, ask your health care provider. Drink enough fluid to keep your urine pale yellow. Avoid caffeine. Do not drink alcohol if your health care provider tells you not to drink. General instructions  Pay close attention to changes in your cough. Tell your health care provider about them. Always cover your mouth when you cough. Avoid things that make you cough, such as perfume, candles, cleaning products, or campfire or tobacco smoke. If the air is dry, use a cool mist vaporizer or humidifier in your bedroom or your home to help loosen secretions. If your cough is worse at night, try to sleep in a semi-upright position. Rest as needed. Keep all follow-up visits as told by your health care provider. This is important. Contact a health care provider if you: Have new symptoms. Cough up pus. Have a cough that does not get better after 2-3 weeks or gets worse. Cannot control your cough with cough suppressant medicines and you are losing sleep. Have pain that gets worse or pain that is not helped with medicine. Have a fever. Have unexplained weight loss. Have night sweats. Get help right away if: You cough up blood. You  have difficulty breathing. Your heartbeat is very fast. These symptoms may represent a serious problem that is an emergency. Do not wait to see if the symptoms will go away. Get medical help right away. Call your local emergency services (911 in the [place] Do not drive yourself to the hospital. Summary Coughing is a reflex that clears your throat and your airways. It is normal to cough occasionally, but a cough that happens with other symptoms or lasts a long time may be a sign of a condition that needs treatment. Take over-the-counter and prescription medicines only as told by your health care provider. Always cover your mouth when you cough. Contact a health care provider if you have new symptoms or a cough that does not get better after 2-3 weeks or gets worse. This  information is not intended to replace advice given to you by your health care provider. Make sure you discuss any questions you have with your health care provider. Document Revised: 12/03/2018 Document Reviewed: 12/03/2018 Elsevier Patient Education  2022 [employer].     I discussed the assessment and treatment plan with the patient. The patient was provided an opportunity to ask questions and all were answered. The patient agreed with the plan and demonstrated an understanding of the instructions.   The patient was advised to call back or seek an in-person evaluation if the symptoms worsen or if the condition fails to improve as anticipated.   Wendie Agreste, MD

## 2022-02-04 NOTE — Patient Instructions (Addendum)
Try taking pepcid over the counter for 1-2 weeks to see if that helps.  ?Restart flonase, but avoid sniffing med after dosing.  ?I will refer you to pulmonary.  ?Tussionex cough syrup at night if needed.  ?Give me an update in next 1 week.  ?Return to the clinic or go to the nearest emergency room if any of your symptoms worsen or new symptoms occur. ? ?Cough, Adult ?Coughing is a reflex that clears your throat and your airways (respiratory system). Coughing helps to heal and protect your lungs. It is normal to cough occasionally, but a cough that happens with other symptoms or lasts a long time may be a sign of a condition that needs treatment. An acute cough may only last 2-3 weeks, while a chronic cough may last 8 or more weeks. ?Coughing is commonly caused by: ?Infection of the respiratory systemby viruses or bacteria. ?Breathing in substances that irritate your lungs. ?Allergies. ?Asthma. ?Mucus that runs down the back of your throat (postnasal drip). ?Smoking. ?Acid backing up from the stomach into the esophagus (gastroesophageal reflux). ?Certain medicines. ?Chronic lung problems. ?Other medical conditions such as heart failure or a blood clot in the lung (pulmonary embolism). ?Follow these instructions at home: ?Medicines ?Take over-the-counter and prescription medicines only as told by your health care provider. ?Talk with your health care provider before you take a cough suppressant medicine. ?Lifestyle ? ?Avoid cigarette smoke. Do not use any products that contain nicotine or tobacco, such as cigarettes, e-cigarettes, and chewing tobacco. If you need help quitting, ask your health care provider. ?Drink enough fluid to keep your urine pale yellow. ?Avoid caffeine. ?Do not drink alcohol if your health care provider tells you not to drink. ?General instructions ? ?Pay close attention to changes in your cough. Tell your health care provider about them. ?Always cover your mouth when you cough. ?Avoid things  that make you cough, such as perfume, candles, cleaning products, or campfire or tobacco smoke. ?If the air is dry, use a cool mist vaporizer or humidifier in your bedroom or your home to help loosen secretions. ?If your cough is worse at night, try to sleep in a semi-upright position. ?Rest as needed. ?Keep all follow-up visits as told by your health care provider. This is important. ?Contact a health care provider if you: ?Have new symptoms. ?Cough up pus. ?Have a cough that does not get better after 2-3 weeks or gets worse. ?Cannot control your cough with cough suppressant medicines and you are losing sleep. ?Have pain that gets worse or pain that is not helped with medicine. ?Have a fever. ?Have unexplained weight loss. ?Have night sweats. ?Get help right away if: ?You cough up blood. ?You have difficulty breathing. ?Your heartbeat is very fast. ?These symptoms may represent a serious problem that is an emergency. Do not wait to see if the symptoms will go away. Get medical help right away. Call your local emergency services (911 in the [place] Do not drive yourself to the hospital. ?Summary ?Coughing is a reflex that clears your throat and your airways. It is normal to cough occasionally, but a cough that happens with other symptoms or lasts a long time may be a sign of a condition that needs treatment. ?Take over-the-counter and prescription medicines only as told by your health care provider. ?Always cover your mouth when you cough. ?Contact a health care provider if you have new symptoms or a cough that does not get better after 2-3 weeks or gets worse. ?  This information is not intended to replace advice given to you by your health care provider. Make sure you discuss any questions you have with your health care provider. ?Document Revised: 12/03/2018 Document Reviewed: 12/03/2018 ?Elsevier Patient Education ? 2022 [place]. ? ? ? ?

## 2022-02-04 NOTE — Telephone Encounter (Signed)
Pt called to changed her appt form office visit to virtual. Pt states that she had procedure yesterday and is in pain. ? ?Thank You. ?

## 2022-03-18 ENCOUNTER — Encounter: Payer: Self-pay | Admitting: Family Medicine

## 2022-03-18 ENCOUNTER — Ambulatory Visit: Payer: 59 | Admitting: Family Medicine

## 2022-03-18 VITALS — BP 128/76 | HR 81 | Temp 97.8°F | Resp 17 | Ht 66.0 in | Wt 213.2 lb

## 2022-03-18 DIAGNOSIS — G4700 Insomnia, unspecified: Secondary | ICD-10-CM

## 2022-03-18 DIAGNOSIS — R400 Somnolence: Secondary | ICD-10-CM | POA: Diagnosis not present

## 2022-03-18 DIAGNOSIS — Z13220 Encounter for screening for lipoid disorders: Secondary | ICD-10-CM

## 2022-03-18 DIAGNOSIS — R7303 Prediabetes: Secondary | ICD-10-CM | POA: Diagnosis not present

## 2022-03-18 DIAGNOSIS — R0683 Snoring: Secondary | ICD-10-CM | POA: Diagnosis not present

## 2022-03-18 DIAGNOSIS — R053 Chronic cough: Secondary | ICD-10-CM | POA: Diagnosis not present

## 2022-03-18 DIAGNOSIS — E559 Vitamin D deficiency, unspecified: Secondary | ICD-10-CM

## 2022-03-18 LAB — COMPREHENSIVE METABOLIC PANEL
ALT: 15 U/L (ref 0–35)
AST: 19 U/L (ref 0–37)
Albumin: 4.2 g/dL (ref 3.5–5.2)
Alkaline Phosphatase: 59 U/L (ref 39–117)
BUN: 14 mg/dL (ref 6–23)
CO2: 26 mEq/L (ref 19–32)
Calcium: 9.3 mg/dL (ref 8.4–10.5)
Chloride: 101 mEq/L (ref 96–112)
Creatinine, Ser: 0.64 mg/dL (ref 0.40–1.20)
GFR: 116.28 mL/min (ref >60.00)
Glucose, Bld: 76 mg/dL (ref 70–99)
Potassium: 4 mEq/L (ref 3.5–5.1)
Sodium: 137 mEq/L (ref 135–145)
Total Bilirubin: 0.6 mg/dL (ref 0.2–1.2)
Total Protein: 7.6 g/dL (ref 6.0–8.3)

## 2022-03-18 LAB — LIPID PANEL
Cholesterol: 186 mg/dL (ref 0–200)
HDL: 71.4 mg/dL (ref >39.00)
LDL Cholesterol: 96 mg/dL (ref 0–99)
NonHDL: 114.65
Total CHOL/HDL Ratio: 3
Triglycerides: 95 mg/dL (ref 0.0–149.0)
VLDL: 19 mg/dL (ref 0.0–40.0)

## 2022-03-18 LAB — HEMOGLOBIN A1C: Hgb A1c MFr Bld: 5.9 % (ref 4.6–6.5)

## 2022-03-18 LAB — VITAMIN D 25 HYDROXY (VIT D DEFICIENCY, FRACTURES): VITD: 15.14 ng/mL — ABNORMAL LOW (ref 30.00–100.00)

## 2022-03-18 NOTE — Patient Instructions (Addendum)
If cough returns, let me know. Possible cyclical cough. No need to see pulmonary at this time.  ? ?Discuss any supplements with GYN.  ?See info below on sleep. You can try Calm or other app to see if that helps. ' ?I will refer you for sleep testing and other labs today.  ?Let me know if there are questions.  ? ?Insomnia ?Insomnia is a sleep disorder that makes it difficult to fall asleep or stay asleep. Insomnia can cause fatigue, low energy, difficulty concentrating, mood swings, and poor performance at work or school. ?There are three different ways to classify insomnia: ?Difficulty falling asleep. ?Difficulty staying asleep. ?Waking up too early in the morning. ?Any type of insomnia can be long-term (chronic) or short-term (acute). Both are common. Short-term insomnia usually lasts for 3 months or less. Chronic insomnia occurs at least three times a week for longer than 3 months. ?What are the causes? ?Insomnia may be caused by another condition, situation, or substance, such as: ?Having certain mental health conditions, such as anxiety and depression. ?Using caffeine, alcohol, tobacco, or drugs. ?Having gastrointestinal conditions, such as gastroesophageal reflux disease (GERD). ?Having certain medical conditions. These include: ?Asthma. ?Alzheimer's disease. ?Stroke. ?Chronic pain. ?An overactive thyroid gland (hyperthyroidism). ?Other sleep disorders, such as restless legs syndrome and sleep apnea. ?Menopause. ?Sometimes, the cause of insomnia may not be known. ?What increases the risk? ?Risk factors for insomnia include: ?Gender. Females are affected more often than males. ?Age. Insomnia is more common as people get older. ?Stress and certain medical and mental health conditions. ?Lack of exercise. ?Having an irregular work schedule. This may include working night shifts and traveling between different time zones. ?What are the signs or symptoms? ?If you have insomnia, the main symptom is having trouble  falling asleep or having trouble staying asleep. This may lead to other symptoms, such as: ?Feeling tired or having low energy. ?Feeling nervous about going to sleep. ?Not feeling rested in the morning. ?Having trouble concentrating. ?Feeling irritable, anxious, or depressed. ?How is this diagnosed? ?This condition may be diagnosed based on: ?Your symptoms and medical history. Your health care provider may ask about: ?Your sleep habits. ?Any medical conditions you have. ?Your mental health. ?A physical exam. ?How is this treated? ?Treatment for insomnia depends on the cause. Treatment may focus on treating an underlying condition that is causing the insomnia. Treatment may also include: ?Medicines to help you sleep. ?Counseling or therapy. ?Lifestyle adjustments to help you sleep better. ?Follow these instructions at home: ?Eating and drinking ? ?Limit or avoid alcohol, caffeinated beverages, and products that contain nicotine and tobacco, especially close to bedtime. These can disrupt your sleep. ?Do not eat a large meal or eat spicy foods right before bedtime. This can lead to digestive discomfort that can make it hard for you to sleep. ?Sleep habits ? ?Keep a sleep diary to help you and your health care provider figure out what could be causing your insomnia. Write down: ?When you sleep. ?When you wake up during the night. ?How well you sleep and how rested you feel the next day. ?Any side effects of medicines you are taking. ?What you eat and drink. ?Make your bedroom a dark, comfortable place where it is easy to fall asleep. ?Put up shades or blackout curtains to block light from outside. ?Use a white noise machine to block noise. ?Keep the temperature cool. ?Limit screen use before bedtime. This includes: ?Not watching TV. ?Not using your smartphone, tablet, or computer. ?Stick  to a routine that includes going to bed and waking up at the same times every day and night. This can help you fall asleep faster.  Consider making a quiet activity, such as reading, part of your nighttime routine. ?Try to avoid taking naps during the day so that you sleep better at night. ?Get out of bed if you are still awake after 15 minutes of trying to sleep. Keep the lights down, but try reading or doing a quiet activity. When you feel sleepy, go back to bed. ?General instructions ?Take over-the-counter and prescription medicines only as told by your health care provider. ?Exercise regularly as told by your health care provider. However, avoid exercising in the hours right before bedtime. ?Use relaxation techniques to manage stress. Ask your health care provider to suggest some techniques that may work well for you. These may include: ?Breathing exercises. ?Routines to release muscle tension. ?Visualizing peaceful scenes. ?Make sure that you drive carefully. Do not drive if you feel very sleepy. ?Keep all follow-up visits. This is important. ?Contact a health care provider if: ?You are tired throughout the day. ?You have trouble in your daily routine due to sleepiness. ?You continue to have sleep problems, or your sleep problems get worse. ?Get help right away if: ?You have thoughts about hurting yourself or someone else. ?Get help right away if you feel like you may hurt yourself or others, or have thoughts about taking your own life. Go to your nearest emergency room or: ?Call 911. ?Call the [place] at [phone] or [phone]. This is open 24 hours a day. ?Text the Crisis Text Line at 843-858-4486. ?Summary ?Insomnia is a sleep disorder that makes it difficult to fall asleep or stay asleep. ?Insomnia can be long-term (chronic) or short-term (acute). ?Treatment for insomnia depends on the cause. Treatment may focus on treating an underlying condition that is causing the insomnia. ?Keep a sleep diary to help you and your health care provider figure out what could be causing your insomnia. ?This information is not  intended to replace advice given to you by your health care provider. Make sure you discuss any questions you have with your health care provider. ?Document Revised: 10/25/2021 Document Reviewed: 10/25/2021 ?Elsevier Patient Education ? [place]. ? ? ? ?

## 2022-03-18 NOTE — Progress Notes (Signed)
? ?Subjective:  ?Patient ID: Paige Stout, female    DOB: [birth year]  Age: 31 y.o. MRN: [id number] ? ?CC:  ?Chief Complaint  ?Patient presents with  ? Establish Care  ?  Pt here to establish care notes Dr Juleen China is no longer in primary care,   ? ?HPI ?Paige Stout presents for  ?Visit to establish care.  Previous primary care provider Dr. Juleen China. ?I have seen her recently for acute issues. ?GYN, Dr. Charlesetta Garibaldi, appointment in February. Hx of PCOS, endometriosis discussed with prior GYN. Working on Scient[employer], research (physical sciences). Trying to become pregnant. Taking PNV. Clomid was prescribed recently.  ? ?Chronic cough ?See recent visits, including most recent visit March 10.  Possible component of reactive airway, upper airway cough syndrome, postnasal cough, cyclical cough, postinfectious cough.  Steroid nasal spray recommended March 10 visit, Pepcid, Tussionex for nighttime suppression, and referred to pulmonary. ?5 days after last visit cough resolved with tussionex. Not needing any meds now.  ? ?Prediabetes: ?With obesity, treated by healthy weight and wellness, last visit in 2021.  Treated with metformin previously, with dosage increased to twice daily in September 2021.  Treated with phentermine previously as well. ?Metformin caused joint aches, stopped it, aches resolved. Off over a year. No recent phentermine. ?Lab Results  ?Component Value Date  ? HGBA1C 5.4 04/14/2020  ? ?Wt Readings from Last 3 Encounters:  ?03/18/22 213 lb 3.2 oz (96.7 kg)  ?02/04/22 203 lb 12.8 oz (92.4 kg)  ?01/13/22 208 lb 3.2 oz (94.4 kg)  ? ?Recurrent insomnia ?Discussed with previous primary provider.  Prescribed trazodone in September 2021, 25 mg initially. Trying holistic approach with magnesium and de-stress.  ?No relief with melatonin.  ?Recent deaths in family - GM and great GM in past 6 weeks.  ?Some nighttime wakening.  ?No prior OSA testing, dentist noted small airway. Snoring, not well rested in am.  ? ?Vitamin D deficiency ?Treated  with supplementation 50,000 units weekly by previous primary provider, healthy weight and wellness.no recent supplement.  ? ? ? ?History ?Patient Active Problem List  ? Diagnosis Date Noted  ? Vitamin D deficiency 04/28/2020  ? 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 Primary insomnia 08/10/2019  ? Menorrhagia 10/04/2018  ? At risk for infertility 10/04/2018  ? Class 1 obesity with serious comorbidity and body mass index (BMI) of 31.0 to 31.9 in adult 10/04/2018  ? PCOS (polycystic ovarian syndrome) suspected 10/04/2018  ? Pelvic pain 09/29/2018  ? Uterine leiomyoma 09/29/2018  ? Dysmenorrhea 09/19/2018  ? ?Past Medical History:  ?Diagnosis Date  ? Constipation   ? Dysmenorrhea 09/19/2018  ? Female infertility   ? Headache   ? Joint pain   ? Menorrhagia 10/04/2018  ? PCOS (polycystic ovarian syndrome)   ? Shingles 2013  ? Uterine leiomyoma 09/29/2018  ? Vitamin B 12 deficiency   ? Vitamin D deficiency   ? ?Past Surgical History:  ?Procedure Laterality Date  ? LAPAROSCOPY N/A 11/12/2018  ? Procedure: LAPAROSCOPY OPERATIVE, Peritoneal Biopsies;  Surgeon: Eldred Manges, MD;  Location: [facility];  Service: Gynecology;  Laterality: N/A;  ? ?No Known Allergies ?Prior to Admission medications   ?Medication Sig Start Date End Date Taking? Authorizing Provider  ?ascorbic acid (VITAMIN C) 500 MG tablet Take 500 mg by mouth daily.   Yes [provider]  ?ELDERBERRY PO Take by mouth.   Yes [provider]  ?Prenatal Vit-Fe Fumarate-FA (PRENATAL MULTIVITAMIN) TABS tablet Take 1 tablet by mouth daily at 12 noon.   Yes  [provider]  ?phentermine (ADIPEX-P) 37.5 MG tablet Take 0.5 tablets (18.75 mg total) by mouth daily before breakfast. ?Patient not taking: Reported on 08/25/2021 08/11/20   Briscoe Deutscher, DO  ?Vitamin D, Ergocalciferol, (DRISDOL) 1.25 MG (50000 UNIT) CAPS capsule Take 1 capsule (50,000 Units total) by mouth every 7 (seven) days. ?Patient not taking: Reported on 08/25/2021 06/17/20   Starlyn Skeans, MD  ? ?Social History  ? ?Socioeconomic History  ? Marital status: Married  ?  Spouse name: Zlaty Alexa  ? Number of children: 0  ? Years of education: Not on file  ? Highest education level: Not on file  ?Occupational History  ? Not on file  ?Tobacco Use  ? Smoking status: Never  ? Smokeless tobacco: Never  ?Vaping Use  ? Vaping Use: Never used  ?Substance and Sexual Activity  ? Alcohol use: No  ? Drug use: No  ? Sexual activity: Yes  ?  Birth control/protection: None  ?Other Topics Concern  ? Not on file  ?Social History Narrative  ? Not on file  ? ?Social Determinants of Health  ? ?Financial Resource Strain: Not on file  ?Food Insecurity: Not on file  ?Transportation Needs: Not on file  ?Physical Activity: Not on file  ?Stress: Not on file  ?Social Connections: Not on file  ?Intimate Partner Violence: Not on file  ? ? ?Review of Systems ?Per HPI.  ? ?Objective:  ? ?Vitals:  ? 03/18/22 0934  ?BP: 128/76  ?Pulse: 81  ?Resp: 17  ?Temp: 97.8 ?F (36.6 ?C)  ?TempSrc: Temporal  ?SpO2: 98%  ?Weight: 213 lb 3.2 oz (96.7 kg)  ?Height: '5\' 6"'$  (1.676 m)  ? ? ? ?Physical Exam ?Vitals reviewed.  ?Constitutional:   ?   Appearance: Normal appearance. Paige Stout is well-developed.  ?HENT:  ?   Head: Normocephalic and atraumatic.  ?Eyes:  ?   Conjunctiva/sclera: Conjunctivae normal.  ?   Pupils: Pupils are equal, round, and reactive to light.  ?Neck:  ?   Vascular: No carotid bruit.  ?Cardiovascular:  ?   Rate and Rhythm: Normal rate and regular rhythm.  ?   Heart sounds: Normal heart sounds.  ?Pulmonary:  ?   Effort: Pulmonary effort is normal. No respiratory distress.  ?   Breath sounds: Normal breath sounds. No stridor. No wheezing or rhonchi.  ?Abdominal:  ?   Palpations: Abdomen is soft. There is no pulsatile mass.  ?   Tenderness: There is no abdominal tenderness.  ?Musculoskeletal:  ?   Right lower leg: No edema.  ?   Left lower leg: No edema.  ?Skin: ?   General: Skin is warm and dry.  ?Neurological:  ?   Mental  Status: Paige Stout is alert and oriented to person, place, and time.  ?Psychiatric:     ?   Mood and Affect: Mood normal.     ?   Behavior: Behavior normal.  ? ? ? ? ? ?Assessment & Plan:  ?Paige Stout is a 31 y.o. female . ?Chronic cough ? -Now resolved.  RTC precautions.  Possible cyclical cough treated with Tussionex. ? ?Insomnia, unspecified type ?Snoring - Plan: Ambulatory referral to Sleep Studies ?Daytime somnolence - Plan: Ambulatory referral to Sleep Studies ? -Sleep hygiene discussed.  Handout given.  We will also refer to sleep studies as some concern for possible underlying obstructive sleep apnea. ? ?Prediabetes - Plan: Hemoglobin A1c ? -Check A1c.  Side effects with metformin as above.  If needed  could consider trial of low-dose to watch for recurrence. ? ?Vitamin D deficiency - Plan: Vitamin D (25 hydroxy) ? -Prior low reading, no current supplementation.  Check labs to determine need ? ?Screening for hyperlipidemia - Plan: Comprehensive metabolic panel, Lipid panel ? ? ?No orders of the defined types were placed in this encounter. ? ?Patient Instructions  ?If cough returns, let me know. Possible cyclical cough. No need to see pulmonary at this time.  ? ?Discuss any supplements with GYN.  ?See info below on sleep. You can try Calm or other app to see if that helps. ' ?I will refer you for sleep testing and other labs today.  ?Let me know if there are questions.  ? ?Insomnia ?Insomnia is a sleep disorder that makes it difficult to fall asleep or stay asleep. Insomnia can cause fatigue, low energy, difficulty concentrating, mood swings, and poor performance at work or school. ?There are three different ways to classify insomnia: ?Difficulty falling asleep. ?Difficulty staying asleep. ?Waking up too early in the morning. ?Any type of insomnia can be long-term (chronic) or short-term (acute). Both are common. Short-term insomnia usually lasts for 3 months or less. Chronic insomnia occurs at least three  times a week for longer than 3 months. ?What are the causes? ?Insomnia may be caused by another condition, situation, or substance, such as: ?Having certain mental health conditions, such as anxiety and depressi

## 2022-03-22 ENCOUNTER — Other Ambulatory Visit: Payer: Self-pay | Admitting: Family Medicine

## 2022-03-22 DIAGNOSIS — E559 Vitamin D deficiency, unspecified: Secondary | ICD-10-CM

## 2022-03-22 MED ORDER — VITAMIN D (ERGOCALCIFEROL) 1.25 MG (50000 UNIT) PO CAPS
50000.0000 [IU] | ORAL_CAPSULE | ORAL | 1 refills | Status: DC
Start: 2022-03-22 — End: 2022-09-23

## 2022-03-22 NOTE — Progress Notes (Signed)
See labs 

## 2022-07-06 ENCOUNTER — Encounter (INDEPENDENT_AMBULATORY_CARE_PROVIDER_SITE_OTHER): Payer: Self-pay

## 2022-08-19 ENCOUNTER — Telehealth: Payer: Self-pay | Admitting: Family Medicine

## 2022-08-19 NOTE — Telephone Encounter (Signed)
Pt called at 3:32pm; stated that she wanted something called in b/c she has been having diarrhea x 3 days; per Noah Delaine and Claiborne Billings, advised that pt should go to urgent care.

## 2022-09-23 ENCOUNTER — Ambulatory Visit (INDEPENDENT_AMBULATORY_CARE_PROVIDER_SITE_OTHER): Payer: 59 | Admitting: Family Medicine

## 2022-09-23 ENCOUNTER — Encounter: Payer: Self-pay | Admitting: Family Medicine

## 2022-09-23 VITALS — BP 122/78 | HR 76 | Temp 98.4°F | Ht 66.5 in | Wt 211.4 lb

## 2022-09-23 DIAGNOSIS — G4700 Insomnia, unspecified: Secondary | ICD-10-CM

## 2022-09-23 DIAGNOSIS — E559 Vitamin D deficiency, unspecified: Secondary | ICD-10-CM

## 2022-09-23 DIAGNOSIS — Z6833 Body mass index (BMI) 33.0-33.9, adult: Secondary | ICD-10-CM

## 2022-09-23 DIAGNOSIS — Z0000 Encounter for general adult medical examination without abnormal findings: Secondary | ICD-10-CM | POA: Diagnosis not present

## 2022-09-23 DIAGNOSIS — R7303 Prediabetes: Secondary | ICD-10-CM

## 2022-09-23 DIAGNOSIS — Z1329 Encounter for screening for other suspected endocrine disorder: Secondary | ICD-10-CM

## 2022-09-23 DIAGNOSIS — Z130 Encounter for screening for diseases of the blood and blood-forming organs and certain disorders involving the immune mechanism: Secondary | ICD-10-CM | POA: Diagnosis not present

## 2022-09-23 DIAGNOSIS — E669 Obesity, unspecified: Secondary | ICD-10-CM

## 2022-09-23 DIAGNOSIS — Z789 Other specified health status: Secondary | ICD-10-CM

## 2022-09-23 LAB — LIPID PANEL
Cholesterol: 182 mg/dL (ref 0–200)
HDL: 63.5 mg/dL (ref >39.00)
LDL Cholesterol: 100 mg/dL — ABNORMAL HIGH (ref 0–99)
NonHDL: 118.36
Total CHOL/HDL Ratio: 3
Triglycerides: 90 mg/dL (ref 0.0–149.0)
VLDL: 18 mg/dL (ref 0.0–40.0)

## 2022-09-23 LAB — COMPREHENSIVE METABOLIC PANEL
ALT: 13 U/L (ref 0–35)
AST: 18 U/L (ref 0–37)
Albumin: 4.1 g/dL (ref 3.5–5.2)
Alkaline Phosphatase: 54 U/L (ref 39–117)
BUN: 13 mg/dL (ref 6–23)
CO2: 29 mEq/L (ref 19–32)
Calcium: 9.4 mg/dL (ref 8.4–10.5)
Chloride: 102 mEq/L (ref 96–112)
Creatinine, Ser: 0.57 mg/dL (ref 0.40–1.20)
GFR: 119.14 mL/min (ref >60.00)
Glucose, Bld: 81 mg/dL (ref 70–99)
Potassium: 3.9 mEq/L (ref 3.5–5.1)
Sodium: 138 mEq/L (ref 135–145)
Total Bilirubin: 0.5 mg/dL (ref 0.2–1.2)
Total Protein: 7.7 g/dL (ref 6.0–8.3)

## 2022-09-23 LAB — CBC
HCT: 37.2 % (ref 36.0–46.0)
Hemoglobin: 12.7 g/dL (ref 12.0–15.0)
MCHC: 34 g/dL (ref 30.0–36.0)
MCV: 86.7 fl (ref 78.0–100.0)
Platelets: 328 10*3/uL (ref 150.0–400.0)
RBC: 4.3 Mil/uL (ref 3.87–5.11)
RDW: 13.4 % (ref 11.5–15.5)
WBC: 7.9 10*3/uL (ref 4.0–10.5)

## 2022-09-23 LAB — VITAMIN D 25 HYDROXY (VIT D DEFICIENCY, FRACTURES): VITD: 15.47 ng/mL — ABNORMAL LOW (ref 30.00–100.00)

## 2022-09-23 MED ORDER — VITAMIN D (ERGOCALCIFEROL) 1.25 MG (50000 UNIT) PO CAPS: 50000.0000 [IU] | ORAL_CAPSULE | ORAL | 1 refills | Status: DC

## 2022-09-23 NOTE — Progress Notes (Unsigned)
Subjective:  Patient ID: Paige Stout, female    DOB: [birth year]  Age: 31 y.o. MRN: [id number]  CC:  Chief Complaint  Patient presents with   Annual Exam    Pt states all is well, pt wants to have full thyroid panel because she was recently diagnosed PCOS 2 years ago and OB recommended it    HPI [place] presents for Annual Exam  Prediabetes with obesity Previously followed by healthy weight and wellness.  Previous metformin treatment, phentermine.  Had myalgias with metformin, off for 1 year at her April visit. Walking more, change in diet.   Lab Results  Component Value Date   HGBA1C 5.9 03/18/2022   Wt Readings from Last 3 Encounters:  09/23/22 211 lb 6.4 oz (95.9 kg)  03/18/22 213 lb 3.2 oz (96.7 kg)  02/04/22 203 lb 12.8 oz (92.4 kg)   Insomnia Discussed in April.  Previously treated with trazodone, then holistic approach.  No relief with melatonin.  Did have some deaths in the family when discussed at last visit.  However she also noted snoring, daytime somnolence.  Referred to sleep studies to eval for possible underlying OSA.  Appears there were voicemails left to schedule appointment but has not yet been seen. Plans to follow up with sleep specialist. Less daytime somnolence. Still waking up at night few times to urinate, gets back to sleep quickly. Helped some with pelvic floor therapy. Less urgency - prior hourly. No daytime urinary sx's. Some limitation if fluids at night - can work on more.   Vitamin D deficiency Started on 50,000 unit/week treatment at her April visit.  Low at that time off meds.  Finished rx dose few months ago - now on 2000iu per day.  Last vitamin D Lab Results  Component Value Date   VD25OH 15.14 (L) 03/18/2022       09/23/2022    8:53 AM 03/18/2022    9:38 AM 02/04/2022   10:59 AM 01/13/2022   11:38 AM 04/14/2020    7:46 AM  Depression screen PHQ 2/9  Decreased Interest 0 0 0 0 1  Down, Depressed, Hopeless 0 0 0 0 0  PHQ  - 2 Score 0 0 0 0 1  Altered sleeping 2   0 1  Tired, decreased energy 0   0 1  Change in appetite 0   0 2  Feeling bad or failure about yourself  0   0 0  Trouble concentrating 0   0 1  Moving slowly or fidgety/restless 0   0 0  Suicidal thoughts 0   0 0  PHQ-9 Score 2   0 6  Difficult doing work/chores    Not difficult at all Somewhat difficult    Health Maintenance  Topic Date Due   COVID-19 Vaccine (1) 10/09/2022 (Originally 06/29/1991)   TETANUS/TDAP  03/19/2023 (Originally 05/17/2021)   Hepatitis C Screening  03/19/2023 (Originally 12/30/2006)   PAP SMEAR-Modifier  01/13/2025   HIV Screening  Completed   HPV VACCINES  Aged Out  Cervical cancer screening, followed by OB/GYN - Dr. Charlesetta Garibaldi, last pap in February - 21623.  Endometriosis, Diagnosed with PCOS few years ago. Joint pain with metfomin in past. Holistic approach. Trying to conceive. Has discussed with GYN - recommended thyroid panel.    Immunization History  Administered Date(s) Administered   Tdap 05/18/2011  Flu vaccine: declines.  Covid booster: declines.  Gardasil - had all 3 in [place], at PCP in [place].  No results found. Wears glasses, appt coming up soon with optho.   Dental: Saw earlier this year.   Alcohol: none.   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   Menorrhagia 10/04/2018   At risk for infertility 10/04/2018   Class 1 obesity with serious comorbidity and body mass index (BMI) of 31.0 to 31.9 in adult 10/04/2018   PCOS (polycystic ovarian syndrome) suspected 10/04/2018   Pelvic pain 09/29/2018   Uterine leiomyoma 09/29/2018   Dysmenorrhea 09/19/2018   Past Medical History:  Diagnosis Date   Constipation    Dysmenorrhea 09/19/2018   Female infertility    Headache    Joint pain    Menorrhagia 10/04/2018   PCOS  (polycystic ovarian syndrome)    Shingles 2013   Uterine leiomyoma 09/29/2018   Vitamin B 12 deficiency    Vitamin D deficiency    Past Surgical History:  Procedure Laterality Date   LAPAROSCOPY N/A 11/12/2018   Procedure: LAPAROSCOPY OPERATIVE, Peritoneal Biopsies;  Surgeon: HEldred Manges MD;  [facility];  Service: Gynecology;  Laterality: N/A;   No Known Allergies Prior to Admission medications   Medication Sig Start Date End Date Taking? Authorizing Provider  ascorbic acid (VITAMIN C) 500 MG tablet Take 500 mg by mouth daily.    [provider]  ELDERBERRY PO Take by mouth.    [provider]  Prenatal Vit-Fe Fumarate-FA (PRENATAL MULTIVITAMIN) TABS tablet Take 1 tablet by mouth daily at 12 noon.    [provider]  Vitamin D, Ergocalciferol, (DRISDOL) 1.25 MG (50000 UNIT) CAPS capsule Take 1 capsule (50,000 Units total) by mouth every 7 (seven) days. 03/22/22   GWendie Agreste MD   Social History   Socioeconomic History   Marital status: Married    Spouse name: TKeilani Terrance  Number of children: 0   Years of education: Not on file   Highest education level: Not on file  Occupational History   Not on file  Tobacco Use   Smoking status: Never   Smokeless tobacco: Never  Vaping Use   Vaping Use: Never used  Substance and Sexual Activity   Alcohol use: No   Drug use: No   Sexual activity: Yes    Birth control/protection: None  Other Topics Concern   Not on file  Social History Narrative   Not on file   Social Determinants of Health   Financial Resource Strain: Not on file  Food Insecurity: Not on file  Transportation Needs: Not on file  Physical Activity: Not on file  Stress: Not on file  Social Connections: Not on file  Intimate Partner Violence: Not on file    Review of Systems 13 point review of systems per patient health survey noted.  Negative other than as indicated above or in HPI.   Objective:   Vitals:    09/23/22 0856  BP: 122/78  Pulse: 76  Temp: 98.4 F (36.9 C)  SpO2: 98%  Weight: 211 lb 6.4 oz (95.9 kg)  Height: 5' 6.5" (1.689 m)   {Vitals History (Optional):23777}  Physical Exam Constitutional:      Appearance: She is well-developed.  HENT:     Head: Normocephalic and atraumatic.     Right Ear: External ear normal.     Left Ear: External ear normal.  Eyes:  Conjunctiva/sclera: Conjunctivae normal.     Pupils: Pupils are equal, round, and reactive to light.  Neck:     Thyroid: No thyromegaly.  Cardiovascular:     Rate and Rhythm: Normal rate and regular rhythm.     Heart sounds: Normal heart sounds. No murmur heard. Pulmonary:     Effort: Pulmonary effort is normal. No respiratory distress.     Breath sounds: Normal breath sounds. No wheezing.  Abdominal:     General: Bowel sounds are normal.     Palpations: Abdomen is soft.     Tenderness: There is no abdominal tenderness.  Musculoskeletal:        General: No tenderness. Normal range of motion.     Cervical back: Normal range of motion and neck supple.  Lymphadenopathy:     Cervical: No cervical adenopathy.  Skin:    General: Skin is warm and dry.     Findings: No rash.  Neurological:     Mental Status: She is alert and oriented to person, place, and time.  Psychiatric:        Behavior: Behavior normal.        Thought Content: Thought content normal.        Assessment & Plan:  Paige Stout is a 31 y.o. female . Annual physical exam  - -anticipatory guidance as below in AVS, screening labs above. Health maintenance items as above in HPI discussed/recommended as applicable.   Vitamin D deficiency - Plan: Vitamin D, Ergocalciferol, (DRISDOL) 1.25 MG (50000 UNIT) CAPS capsule, Vitamin D (25 hydroxy)   Insomnia, unspecified type  Prediabetes - Plan: Comprehensive metabolic panel, Lipid panel  Screening for thyroid disorder - Plan: Thyroid Panel With TSH  Attempting to conceive - Plan:  Thyroid Panel With TSH  Class 1 obesity with body mass index (BMI) of 33.0 to 33.9 in adult, unspecified obesity type, unspecified whether serious comorbidity present  Screening, anemia, deficiency, iron - Plan: CBC   Meds ordered this encounter  Medications   Vitamin D, Ergocalciferol, (DRISDOL) 1.25 MG (50000 UNIT) CAPS capsule    Sig: Take 1 capsule (50,000 Units total) by mouth every 7 (seven) days.    Dispense:  12 capsule    Refill:  1   Patient Instructions  Call sleep specialist for follow up.  Restrict fluids within 1-2 hrs of bedtime, drink plenty of fluids during the day.  I will check some labs today and let you know if any concerns.  Return to the clinic or go to the nearest emergency room if any of your symptoms worsen or new symptoms occur.   Preventive Care 55-49 Years Old, Female Preventive care refers to lifestyle choices and visits with your health care provider that can promote health and wellness. Preventive care visits are also called wellness exams. What can I expect for my preventive care visit? Counseling During your preventive care visit, your health care provider may ask about your: Medical history, including: Past medical problems. Family medical history. Pregnancy history. Current health, including: Menstrual cycle. Method of birth control. Emotional well-being. Home life and relationship well-being. Sexual activity and sexual health. Lifestyle, including: Alcohol, nicotine or tobacco, and drug use. Access to firearms. Diet, exercise, and sleep habits. Work and work Statistician. Sunscreen use. Safety issues such as seatbelt and bike helmet use. Physical exam Your health care provider may check your: Height and weight. These may be used to calculate your BMI (body mass index). BMI is a measurement that tells if you are  at a healthy weight. Waist circumference. This measures the distance around your waistline. This measurement also tells if you  are at a healthy weight and may help predict your risk of certain diseases, such as type 2 diabetes and high blood pressure. Heart rate and blood pressure. Body temperature. Skin for abnormal spots. What immunizations do I need?  Vaccines are usually given at various ages, according to a schedule. Your health care provider will recommend vaccines for you based on your age, medical history, and lifestyle or other factors, such as travel or where you work. What tests do I need? Screening Your health care provider may recommend screening tests for certain conditions. This may include: Pelvic exam and Pap test. Lipid and cholesterol levels. Diabetes screening. This is done by checking your blood sugar (glucose) after you have not eaten for a while (fasting). Hepatitis B test. Hepatitis C test. HIV (human immunodeficiency virus) test. STI (sexually transmitted infection) testing, if you are at risk. BRCA-related cancer screening. This may be done if you have a family history of breast, ovarian, tubal, or peritoneal cancers. Talk with your health care provider about your test results, treatment options, and if necessary, the need for more tests. Follow these instructions at home: Eating and drinking  Eat a healthy diet that includes fresh fruits and vegetables, whole grains, lean protein, and low-fat dairy products. Take vitamin and mineral supplements as recommended by your health care provider. Do not drink alcohol if: Your health care provider tells you not to drink. You are pregnant, may be pregnant, or are planning to become pregnant. If you drink alcohol: Limit how much you have to 0-1 drink a day. Know how much alcohol is in your drink. In the [place] one drink equals one 12 oz bottle of beer (355 mL), one 5 oz glass of wine (148 mL), or one 1 oz glass of hard liquor (44 mL). Lifestyle Brush your teeth every morning and night with fluoride toothpaste. Floss one time each day. Exercise  for at least 30 minutes 5 or more days each week. Do not use any products that contain nicotine or tobacco. These products include cigarettes, chewing tobacco, and vaping devices, such as e-cigarettes. If you need help quitting, ask your health care provider. Do not use drugs. If you are sexually active, practice safe sex. Use a condom or other form of protection to prevent STIs. If you do not wish to become pregnant, use a form of birth control. If you plan to become pregnant, see your health care provider for a prepregnancy visit. Find healthy ways to manage stress, such as: Meditation, yoga, or listening to music. Journaling. Talking to a trusted person. Spending time with friends and family. Minimize exposure to UV radiation to reduce your risk of skin cancer. Safety Always wear your seat belt while driving or riding in a vehicle. Do not drive: If you have been drinking alcohol. Do not ride with someone who has been drinking. If you have been using any mind-altering substances or drugs. While texting. When you are tired or distracted. Wear a helmet and other protective equipment during sports activities. If you have firearms in your house, make sure you follow all gun safety procedures. Seek help if you have been physically or sexually abused. What's next? Go to your health care provider once a year for an annual wellness visit. Ask your health care provider how often you should have your eyes and teeth checked. Stay up to date on all vaccines.  This information is not intended to replace advice given to you by your health care provider. Make sure you discuss any questions you have with your health care provider. Document Revised: 05/12/2021 Document Reviewed: 05/12/2021 Elsevier Patient Education  [place],   Merri Ray, MD [place] 09/23/22 9:28 AM

## 2022-09-23 NOTE — Patient Instructions (Addendum)
Call sleep specialist for follow up.  Restrict fluids within 1-2 hrs of bedtime, drink plenty of fluids during the day.  I will check some labs today and let you know if any concerns.  Return to the clinic or go to the nearest emergency room if any of your symptoms worsen or new symptoms occur.   Preventive Care 26-33 Years Old, Female Preventive care refers to lifestyle choices and visits with your health care provider that can promote health and wellness. Preventive care visits are also called wellness exams. What can I expect for my preventive care visit? Counseling During your preventive care visit, your health care provider may ask about your: Medical history, including: Past medical problems. Family medical history. Pregnancy history. Current health, including: Menstrual cycle. Method of birth control. Emotional well-being. Home life and relationship well-being. Sexual activity and sexual health. Lifestyle, including: Alcohol, nicotine or tobacco, and drug use. Access to firearms. Diet, exercise, and sleep habits. Work and work Statistician. Sunscreen use. Safety issues such as seatbelt and bike helmet use. Physical exam Your health care provider may check your: Height and weight. These may be used to calculate your BMI (body mass index). BMI is a measurement that tells if you are at a healthy weight. Waist circumference. This measures the distance around your waistline. This measurement also tells if you are at a healthy weight and may help predict your risk of certain diseases, such as type 2 diabetes and high blood pressure. Heart rate and blood pressure. Body temperature. Skin for abnormal spots. What immunizations do I need?  Vaccines are usually given at various ages, according to a schedule. Your health care provider will recommend vaccines for you based on your age, medical history, and lifestyle or other factors, such as travel or where you work. What tests do I  need? Screening Your health care provider may recommend screening tests for certain conditions. This may include: Pelvic exam and Pap test. Lipid and cholesterol levels. Diabetes screening. This is done by checking your blood sugar (glucose) after you have not eaten for a while (fasting). Hepatitis B test. Hepatitis C test. HIV (human immunodeficiency virus) test. STI (sexually transmitted infection) testing, if you are at risk. BRCA-related cancer screening. This may be done if you have a family history of breast, ovarian, tubal, or peritoneal cancers. Talk with your health care provider about your test results, treatment options, and if necessary, the need for more tests. Follow these instructions at home: Eating and drinking  Eat a healthy diet that includes fresh fruits and vegetables, whole grains, lean protein, and low-fat dairy products. Take vitamin and mineral supplements as recommended by your health care provider. Do not drink alcohol if: Your health care provider tells you not to drink. You are pregnant, may be pregnant, or are planning to become pregnant. If you drink alcohol: Limit how much you have to 0-1 drink a day. Know how much alcohol is in your drink. In the [place] one drink equals one 12 oz bottle of beer (355 mL), one 5 oz glass of wine (148 mL), or one 1 oz glass of hard liquor (44 mL). Lifestyle Brush your teeth every morning and night with fluoride toothpaste. Floss one time each day. Exercise for at least 30 minutes 5 or more days each week. Do not use any products that contain nicotine or tobacco. These products include cigarettes, chewing tobacco, and vaping devices, such as e-cigarettes. If you need help quitting, ask your health care provider. Do not  use drugs. If you are sexually active, practice safe sex. Use a condom or other form of protection to prevent STIs. If you do not wish to become pregnant, use a form of birth control. If you plan to become  pregnant, see your health care provider for a prepregnancy visit. Find healthy ways to manage stress, such as: Meditation, yoga, or listening to music. Journaling. Talking to a trusted person. Spending time with friends and family. Minimize exposure to UV radiation to reduce your risk of skin cancer. Safety Always wear your seat belt while driving or riding in a vehicle. Do not drive: If you have been drinking alcohol. Do not ride with someone who has been drinking. If you have been using any mind-altering substances or drugs. While texting. When you are tired or distracted. Wear a helmet and other protective equipment during sports activities. If you have firearms in your house, make sure you follow all gun safety procedures. Seek help if you have been physically or sexually abused. What's next? Go to your health care provider once a year for an annual wellness visit. Ask your health care provider how often you should have your eyes and teeth checked. Stay up to date on all vaccines. This information is not intended to replace advice given to you by your health care provider. Make sure you discuss any questions you have with your health care provider. Document Revised: 05/12/2021 Document Reviewed: 05/12/2021 Elsevier Patient Education  [place].

## 2022-09-24 LAB — THYROID PANEL WITH TSH
Free Thyroxine Index: 2.3 (ref 1.4–3.8)
T3 Uptake: 25 % (ref 22–35)
T4, Total: 9.1 ug/dL (ref 5.1–11.9)
TSH: 0.88 mIU/L

## 2022-10-01 ENCOUNTER — Other Ambulatory Visit: Payer: Self-pay | Admitting: Family Medicine

## 2022-10-01 DIAGNOSIS — E559 Vitamin D deficiency, unspecified: Secondary | ICD-10-CM

## 2022-10-01 MED ORDER — VITAMIN D (ERGOCALCIFEROL) 1.25 MG (50000 UNIT) PO CAPS: 50000.0000 [IU] | ORAL_CAPSULE | ORAL | 1 refills | Status: AC

## 2022-10-01 NOTE — Progress Notes (Signed)
See labs 

## 2023-02-07 ENCOUNTER — Encounter: Payer: Self-pay | Admitting: Family Medicine

## 2023-02-26 IMAGING — DX DG CHEST 2V
2 series · 2 of 2 positions shown · non-contrast
Comparison: None.

CLINICAL DATA: Persistent dry cough for 3 months

EXAM:
CHEST - 2 VIEW

[chest pa]
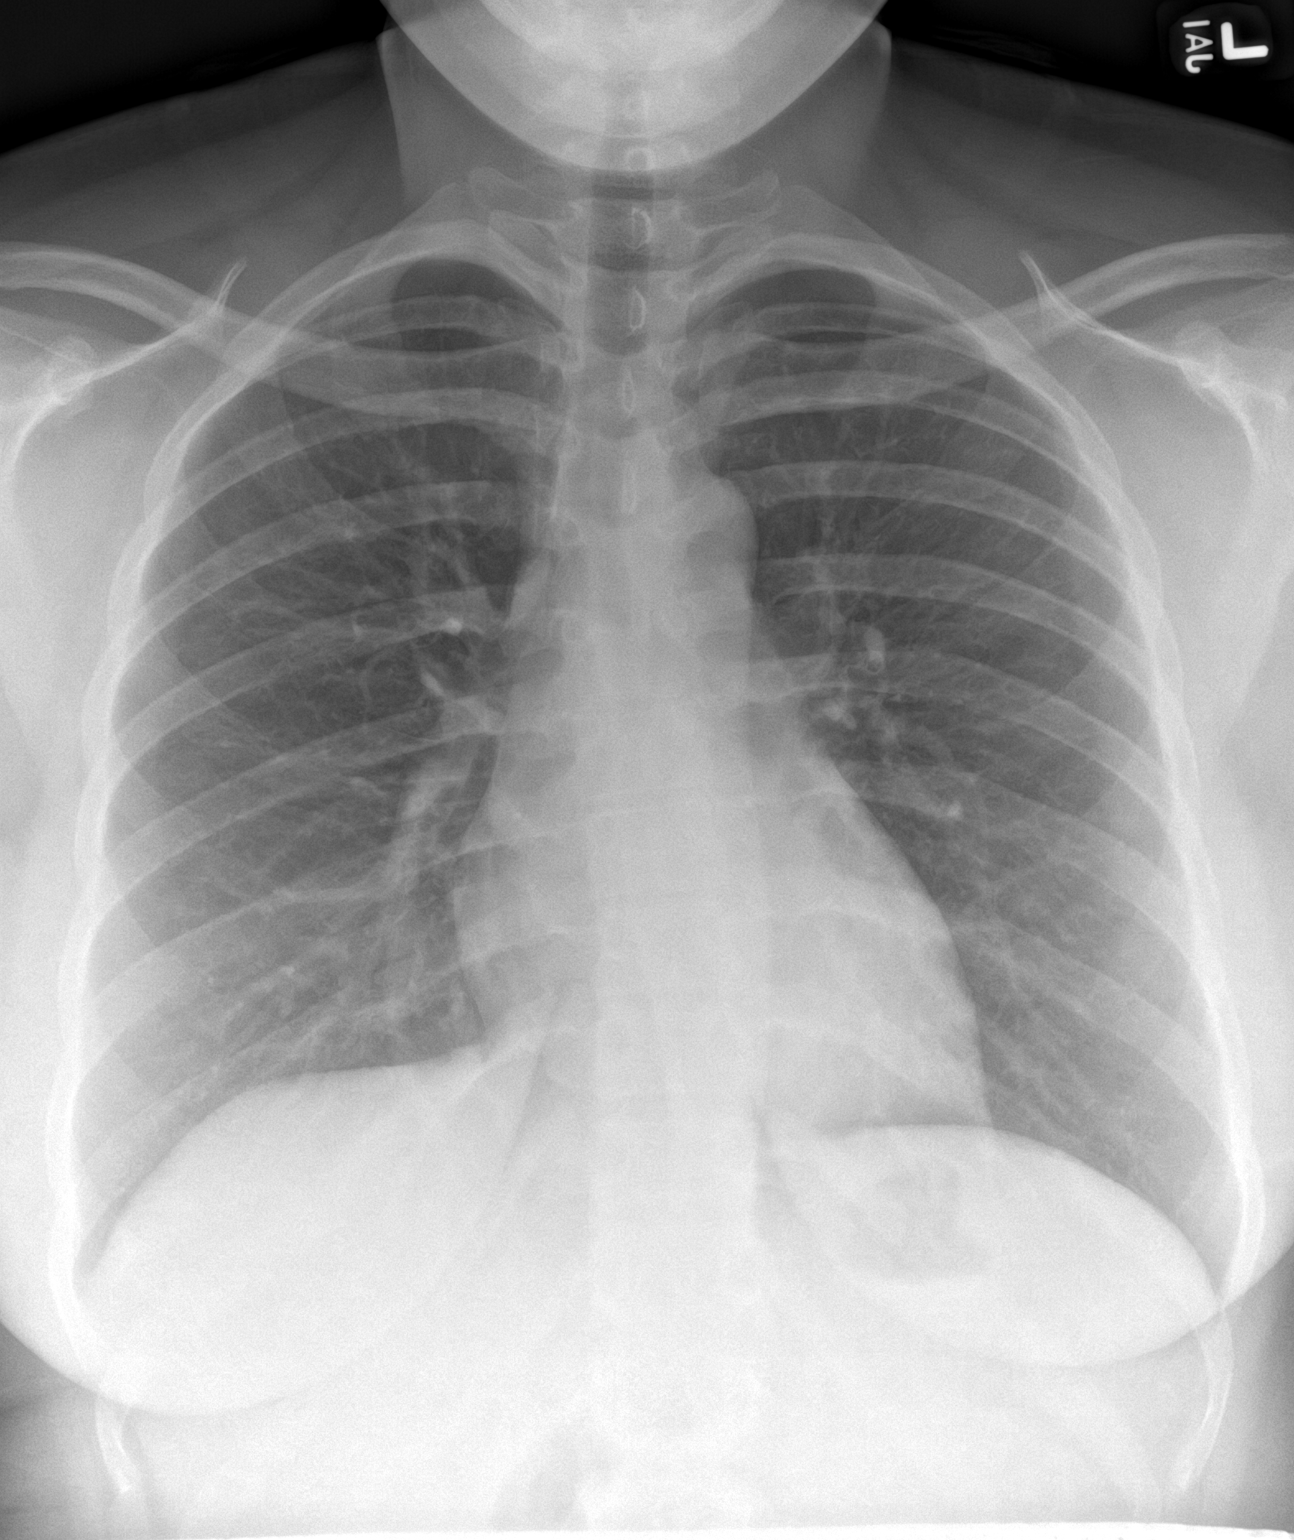

[chest lat]
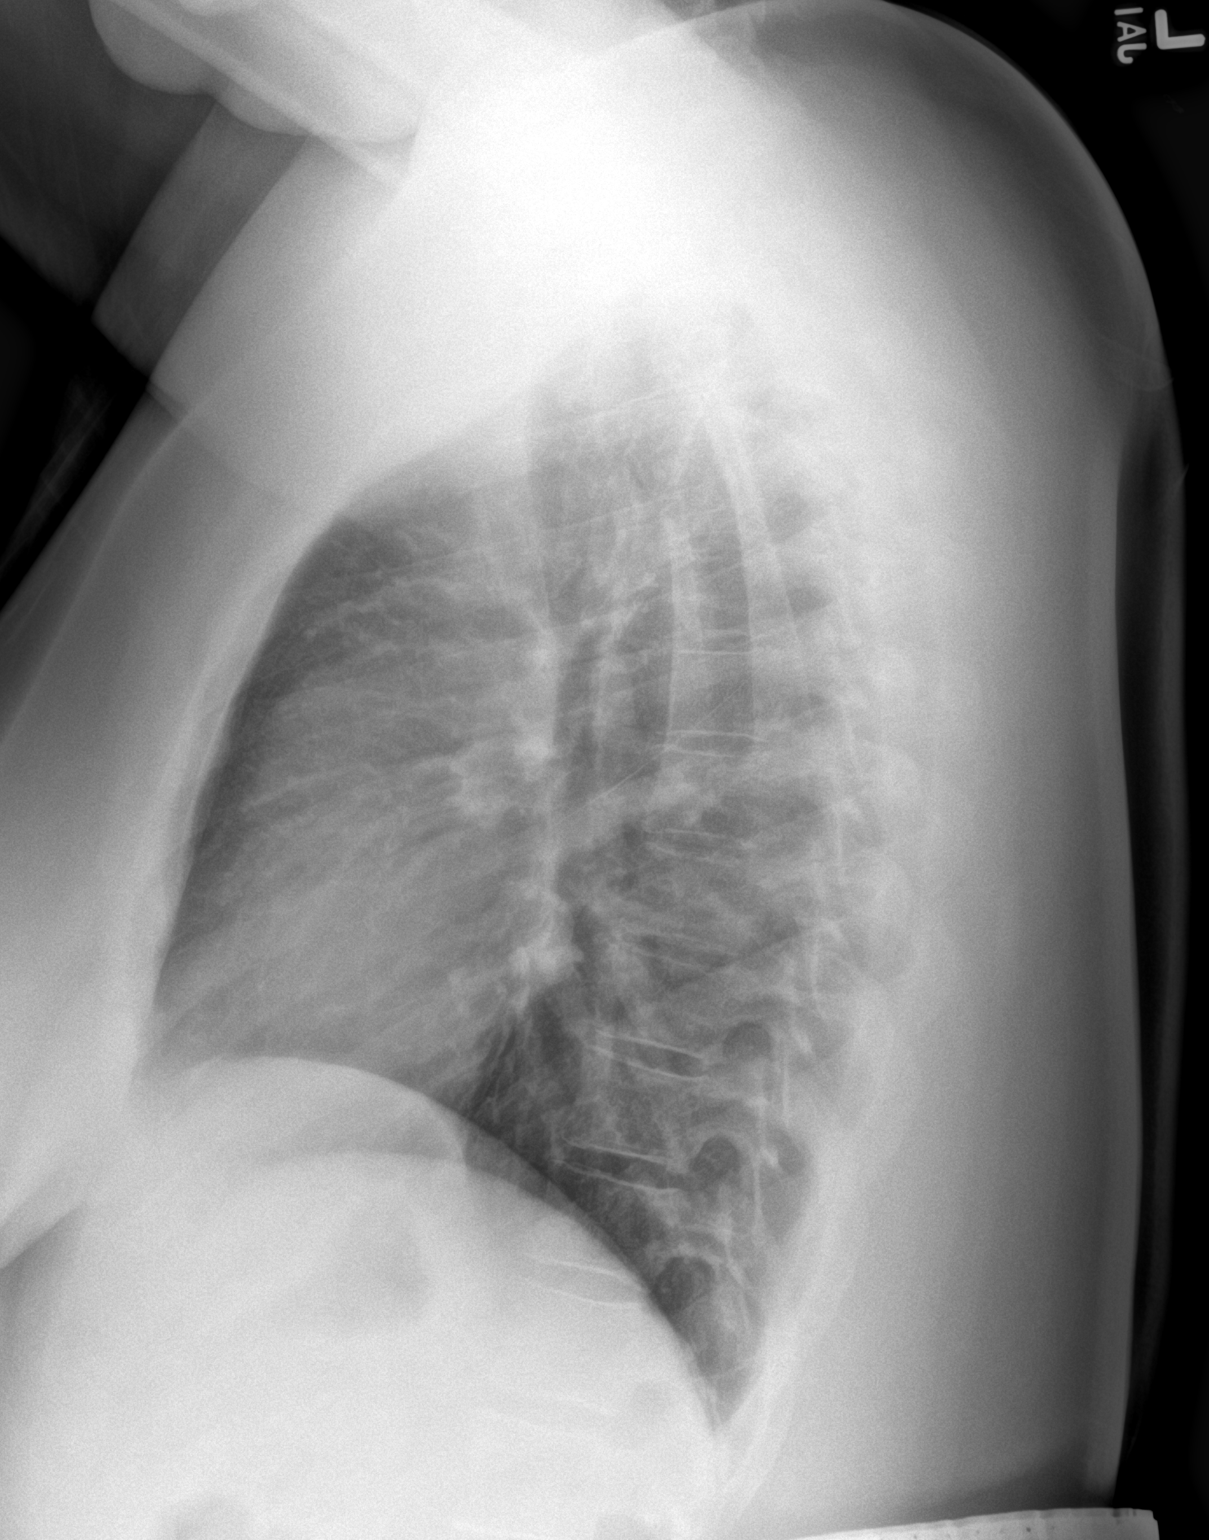

[2 of 2 positions shown; findings below may reference images not displayed]

FINDINGS: The cardiomediastinal silhouette is normal.

There is no focal consolidation or pulmonary edema. There is no
pleural effusion or pneumothorax.

There is no acute osseous abnormality. There is slight
dextrocurvature in the upper thoracic spine.
IMPRESSION: No radiographic evidence of acute cardiopulmonary process.

## 2023-03-03 ENCOUNTER — Ambulatory Visit: Payer: 59 | Admitting: Family Medicine

## 2023-03-11 IMAGING — RF DG HYSTEROGRAM
1 series · 8 of 8 positions shown · IV contrast (omnipaque)
Comparison: None.

CLINICAL DATA: Primary infertility.  Endometriosis.  Fibroids.

EXAM:
HYSTEROSALPINGOGRAM
TECHNIQUE: Following cleansing of the cervix and vagina with Betadine solution,
a hysterosalpingogram was performed using a 5-French
hysterosalpingogram catheter and Omnipaque 300 contrast. The patient
tolerated the examination without difficulty.

[Series 1: one shot · 8 of 8 slices shown]
[im 1/8]
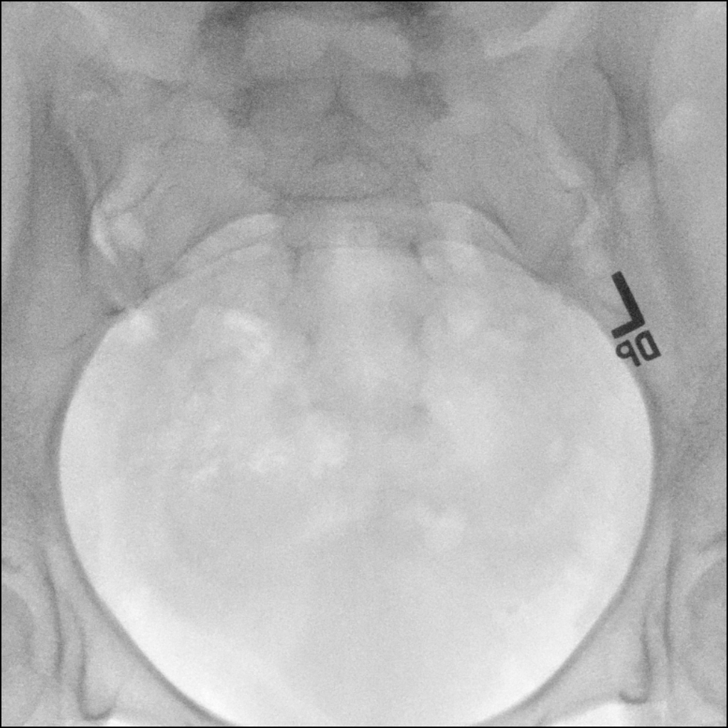
[im 2/8]
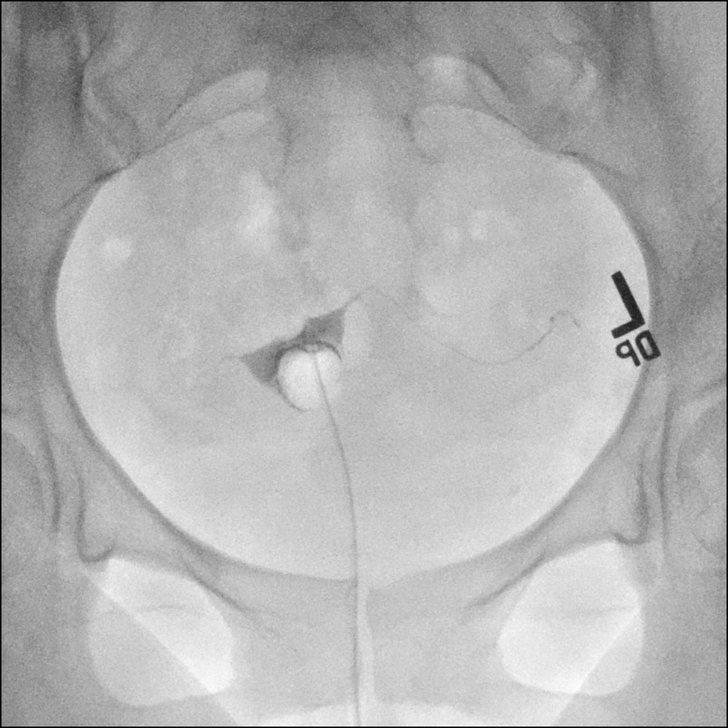
[im 3/8]
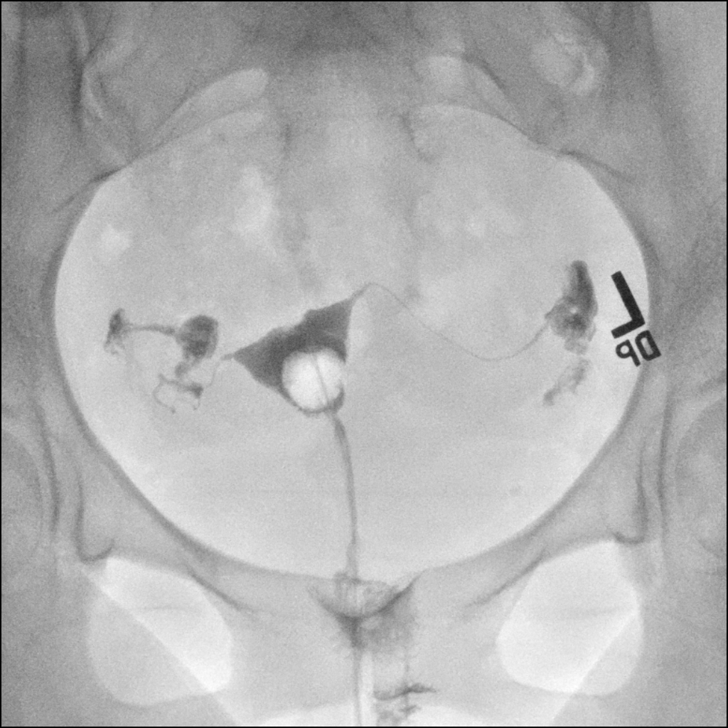
[im 4/8]
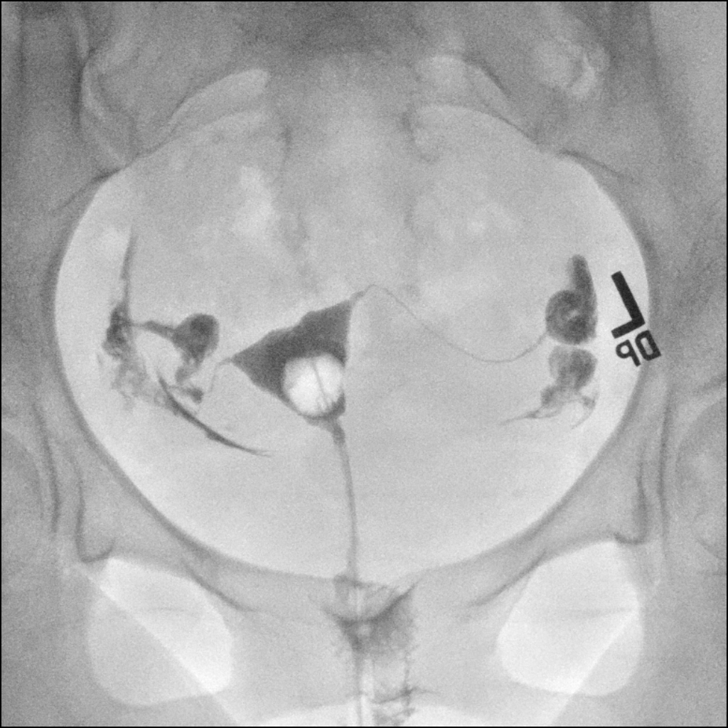
[im 5/8]
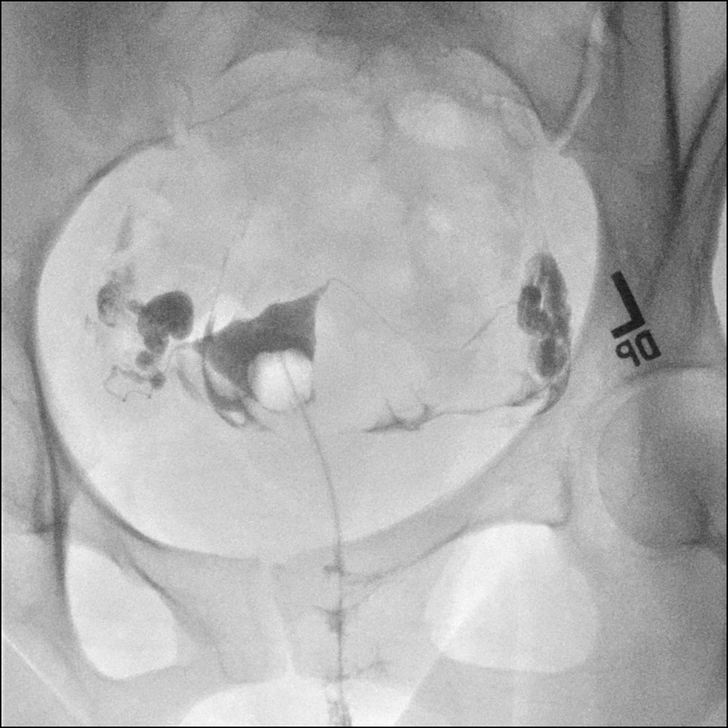
[im 6/8]
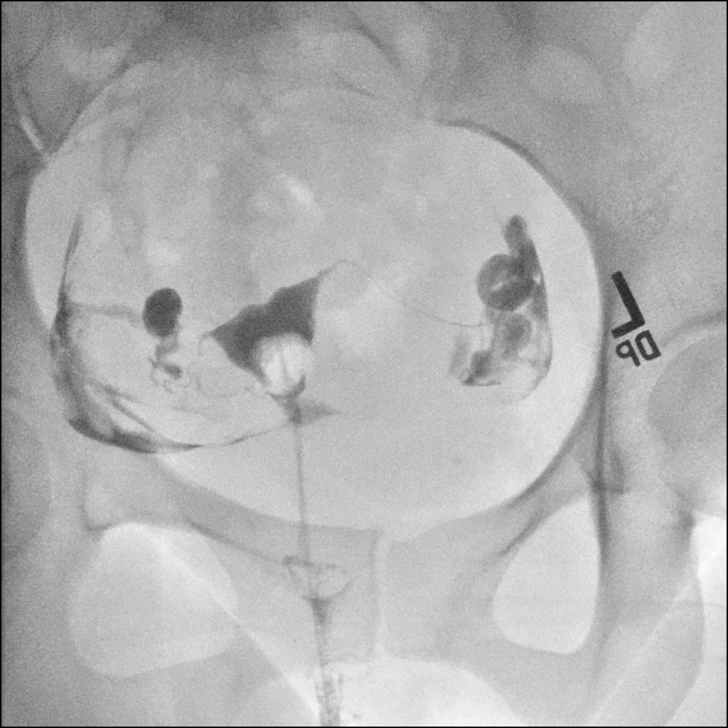
[im 7/8]
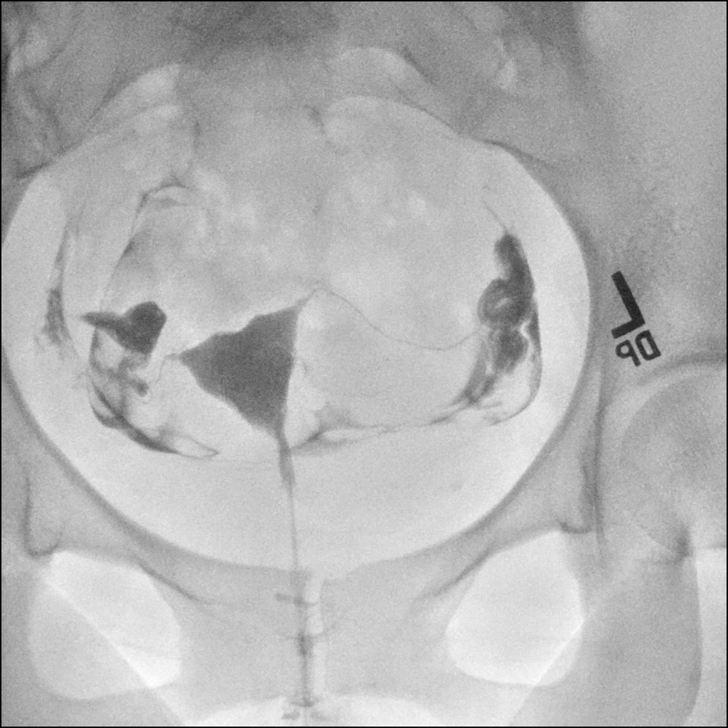
[im 8/8]
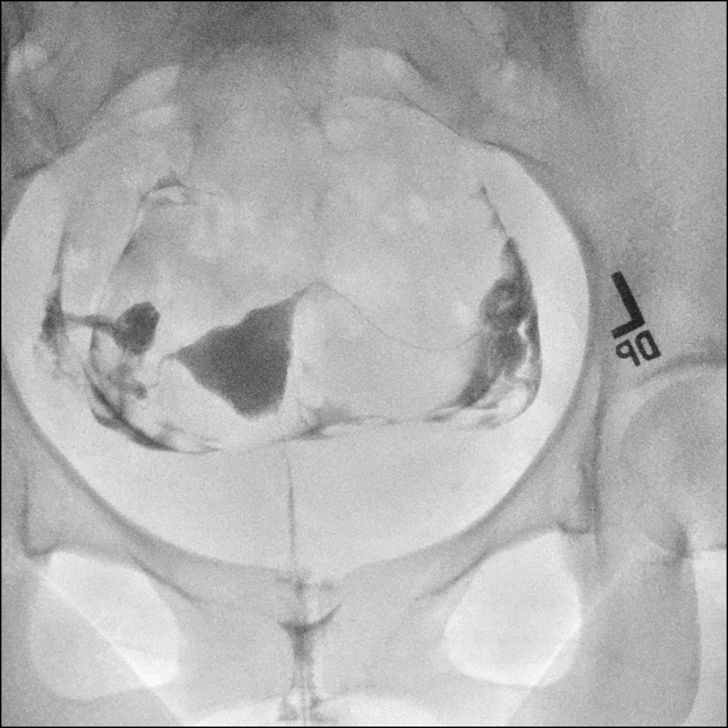

[8 of 8 positions shown; findings below may reference images not displayed]

FLUOROSCOPY:
Radiation Exposure Index (as provided by the fluoroscopic device):
23.3 mGy Kerma
FINDINGS: Endometrial Cavity: Normal appearance. No signs of Mullerian duct
anomaly or other significant abnormality.

Right Fallopian Tube: Normal appearance. Free intraperitoneal spill
of contrast is demonstrated.

Left Fallopian Tube: Normal appearance. Free intraperitoneal spill
of contrast is demonstrated.

Other:  None.
IMPRESSION: Normal study. Both Fallopian tubes are patent.

## 2023-09-23 ENCOUNTER — Ambulatory Visit (HOSPITAL_COMMUNITY): Payer: 59

## 2023-09-23 ENCOUNTER — Encounter (HOSPITAL_COMMUNITY): Payer: Self-pay

## 2023-09-23 ENCOUNTER — Ambulatory Visit (HOSPITAL_COMMUNITY)
Admission: EM | Admit: 2023-09-23 | Discharge: 2023-09-23 | Disposition: A | Discharge status: Home / Self Care | Payer: 59 | Attending: Emergency Medicine | Admitting: Emergency Medicine

## 2023-09-23 ENCOUNTER — Telehealth (HOSPITAL_COMMUNITY): Payer: Self-pay | Admitting: *Deleted

## 2023-09-23 DIAGNOSIS — W109XXA Fall (on) (from) unspecified stairs and steps, initial encounter: Secondary | ICD-10-CM

## 2023-09-23 DIAGNOSIS — M25552 Pain in left hip: Secondary | ICD-10-CM

## 2023-09-23 MED ORDER — IBUPROFEN 800 MG PO TABS
800.0000 mg | ORAL_TABLET | Freq: Once | ORAL | Status: AC
  Administered 2023-09-23: 800 mg via ORAL

## 2023-09-23 MED ORDER — IBUPROFEN 800 MG PO TABS: 800.0000 mg | ORAL_TABLET | Freq: Three times a day (TID) | ORAL | 0 refills | Status: AC

## 2023-09-23 MED ORDER — IBUPROFEN 800 MG PO TABS: 800.0000 mg | ORAL_TABLET | Freq: Three times a day (TID) | ORAL | 0 refills | Status: DC

## 2023-09-23 MED ORDER — IBUPROFEN 800 MG PO TABS
ORAL_TABLET | ORAL | Status: AC
Start: 2023-09-23 — End: ?
  Filled 2023-09-23: qty 1

## 2023-09-23 NOTE — ED Triage Notes (Signed)
Patient slipped going down the stairs and landed on the left side of her back. Having left sided hip pain, unable to sit or stand straight. Onset around an hour ago.

## 2023-09-23 NOTE — Discharge Instructions (Addendum)
Follow RICE instructions as attached Take ibuprofen 800 mg every 8 hours as needed for pain Follow-up with orthopedic Return or go to ED if you develop any new or worsening of your symptoms

## 2023-09-23 NOTE — ED Provider Notes (Signed)
[facility]   [id number] 09/23/23 Arrival Time: 1646   Chief Complaint  Patient presents with   Fall   Hip Pain          SUBJECTIVE: History from: patient.  Paige Stout is a 32 y.o. female who presented to the urgent care with a complaint of left hip pain that occurred today.  Developed a symptom after slipping down a stair and landed on her left side.  Unable to sit and stand.  Reported worsening pain with range of motion.  Has not tried any OTC medication for relief.  Denies any alleviating factor.  Denies previous symptoms in the past.  Denies chills, fever, nausea, vomiting diarrhea.   ROS: As per HPI.  All other pertinent ROS negative.      Past Medical History:  Diagnosis Date   Constipation    Dysmenorrhea 09/19/2018   Female infertility    Headache    Joint pain    Menorrhagia 10/04/2018   PCOS (polycystic ovarian syndrome)    Shingles 2013   Uterine leiomyoma 09/29/2018   Vitamin B 12 deficiency    Vitamin D deficiency    Past Surgical History:  Procedure Laterality Date   LAPAROSCOPY N/A 11/12/2018   Procedure: LAPAROSCOPY OPERATIVE, Peritoneal Biopsies;  Surgeon: Hal Morales, MD;  Location: [facility];  Service: Gynecology;  Laterality: N/A;   No Known Allergies No current facility-administered medications on file prior to encounter.   Current Outpatient Medications on File Prior to Encounter  Medication Sig Dispense Refill   ascorbic acid (VITAMIN C) 500 MG tablet Take 500 mg by mouth daily.     ELDERBERRY PO Take by mouth.     Prenatal Vit-Fe Fumarate-FA (PRENATAL MULTIVITAMIN) TABS tablet Take 1 tablet by mouth daily at 12 noon. Pt does not take consistent     Vitamin D, Ergocalciferol, (DRISDOL) 1.25 MG (50000 UNIT) CAPS capsule Take 1 capsule (50,000 Units total) by mouth every 7 (seven) days. 12 capsule 1   Social History   Socioeconomic History   Marital status: Married    Spouse name: Avonne Balmes   Number of  children: 0   Years of education: Not on file   Highest education level: Not on file  Occupational History   Not on file  Tobacco Use   Smoking status: Never   Smokeless tobacco: Never  Vaping Use   Vaping status: Never Used  Substance and Sexual Activity   Alcohol use: No   Drug use: No   Sexual activity: Yes    Birth control/protection: None  Other Topics Concern   Not on file  Social History Narrative   Not on file   Social Determinants of Health   Financial Resource Strain: Not on file  Food Insecurity: Not on file  Transportation Needs: Not on file  Physical Activity: Not on file  Stress: Not on file  Social Connections: Not on file  Intimate Partner Violence: Not on file   Family History  Problem Relation Age of Onset   Breast cancer Maternal Aunt    Breast cancer Paternal Aunt    Kidney cancer Father    Diabetes Father    Hearing loss Father    Hypertension Father    Sleep apnea Father    Obesity Father     OBJECTIVE:  Vitals:   09/23/23 1802 09/23/23 1803  BP:  98/63  Pulse:  87  Resp:  18  Temp:  98.4 F (36.9 C)  TempSrc:  Oral  SpO2:  97%  Weight: 215 lb (97.5 kg)   Height: 5\' 6"  (1.676 m)      Physical Exam Vitals and nursing note reviewed.  Constitutional:      General: She is not in acute distress.    Appearance: Normal appearance. She is normal weight. She is not ill-appearing, toxic-appearing or diaphoretic.  HENT:     Head: Normocephalic.  Cardiovascular:     Rate and Rhythm: Normal rate and regular rhythm.     Pulses: Normal pulses.     Heart sounds: Normal heart sounds. No murmur heard.    No friction rub. No gallop.  Pulmonary:     Effort: Pulmonary effort is normal. No respiratory distress.     Breath sounds: Normal breath sounds. No stridor. No wheezing, rhonchi or rales.  Chest:     Chest wall: No tenderness.  Musculoskeletal:     Right hip: Normal.     Left hip: Tenderness present.     Comments: The left hip is  without any obvious asymmetry or deformity when compared to the right hip.  There is no ecchymosis, open wound, lesion or swelling present.  Pain with range of motion.  Neurological:     Mental Status: She is alert and oriented to person, place, and time.      LABS:  No results found for this or any previous visit (from the past 24 hour(s)).   RADIOLOGY:  DG Hip Unilat With Pelvis 2-3 Views Left  Result Date: 09/23/2023 CLINICAL DATA:  Hip pain after a fall. EXAM: DG HIP (WITH OR WITHOUT PELVIS) 3V LEFT COMPARISON:  None Available. FINDINGS: There is no evidence of hip fracture or dislocation. There is no evidence of arthropathy or other focal bone abnormality. IMPRESSION: Negative left hip radiographs. Electronically Signed   By: Marin Roberts M.D.   On: 09/23/2023 19:34     X-ray is  negative for bony abnormality including fracture or dislocation.  I have reviewed the x-ray myself and the radiologist interpretation.  I am in agreement with the radiologist interpretation.   ASSESSMENT & PLAN:  1. Left hip pain   2. Fall from stairs     Meds ordered this encounter  Medications   DISCONTD: ibuprofen (ADVIL) 800 MG tablet    Sig: Take 1 tablet (800 mg total) by mouth 3 (three) times daily for 10 days.    Dispense:  30 tablet    Refill:  0   ibuprofen (ADVIL) tablet 800 mg    Discharge instructions  Follow RICE instructions as attached Take ibuprofen 800 mg every 8 hours as needed for pain Follow-up with orthopedic Return or go to ED if you develop any new or worsening of your symptoms  Reviewed expectations re: course of current medical issues. Questions answered. Outlined signs and symptoms indicating need for more acute intervention. Patient verbalized understanding. After Visit Summary given.          Durward Parcel, FNP 09/23/23 2100

## 2024-08-01 ENCOUNTER — Encounter: Payer: Self-pay | Admitting: Family Medicine

## 2024-08-01 ENCOUNTER — Ambulatory Visit (INDEPENDENT_AMBULATORY_CARE_PROVIDER_SITE_OTHER): Admitting: Family Medicine

## 2024-08-01 VITALS — BP 118/68 | HR 91 | Temp 98.4°F | Resp 13 | Ht 66.0 in | Wt 221.2 lb

## 2024-08-01 DIAGNOSIS — Z1329 Encounter for screening for other suspected endocrine disorder: Secondary | ICD-10-CM

## 2024-08-01 DIAGNOSIS — G4700 Insomnia, unspecified: Secondary | ICD-10-CM | POA: Diagnosis not present

## 2024-08-01 DIAGNOSIS — R7303 Prediabetes: Secondary | ICD-10-CM

## 2024-08-01 DIAGNOSIS — N910 Primary amenorrhea: Secondary | ICD-10-CM

## 2024-08-01 DIAGNOSIS — Z130 Encounter for screening for diseases of the blood and blood-forming organs and certain disorders involving the immune mechanism: Secondary | ICD-10-CM

## 2024-08-01 DIAGNOSIS — E559 Vitamin D deficiency, unspecified: Secondary | ICD-10-CM

## 2024-08-01 DIAGNOSIS — Z0000 Encounter for general adult medical examination without abnormal findings: Secondary | ICD-10-CM | POA: Diagnosis not present

## 2024-08-01 DIAGNOSIS — Z13220 Encounter for screening for lipoid disorders: Secondary | ICD-10-CM | POA: Diagnosis not present

## 2024-08-01 LAB — POCT URINE PREGNANCY: Preg Test, Ur: NEGATIVE

## 2024-08-01 NOTE — Patient Instructions (Signed)
 Keep follow-up with gynecology as planned. I will check your labs again today including the prediabetes test and vitamin D  test to see if we need to change the treatment.  Keep up the good work with walking.  If any other concerns on labs I will let you know.  See information below on insomnia, follow-up if that persists or not improved with treatment options below.  Take care!  Preventive Care 31-35 Years Old, Female Preventive care refers to lifestyle choices and visits with your health care provider that can promote health and wellness. Preventive care visits are also called wellness exams. What can I expect for my preventive care visit? Counseling During your preventive care visit, your health care provider may ask about your: Medical history, including: Past medical problems. Family medical history. Pregnancy history. Current health, including: Menstrual cycle. Method of birth control. Emotional well-being. Home life and relationship well-being. Sexual activity and sexual health. Lifestyle, including: Alcohol, nicotine or tobacco, and drug use. Access to firearms. Diet, exercise, and sleep habits. Work and work Astronomer. Sunscreen use. Safety issues such as seatbelt and bike helmet use. Physical exam Your health care provider may check your: Height and weight. These may be used to calculate your BMI (body mass index). BMI is a measurement that tells if you are at a healthy weight. Waist circumference. This measures the distance around your waistline. This measurement also tells if you are at a healthy weight and may help predict your risk of certain diseases, such as type 2 diabetes and high blood pressure. Heart rate and blood pressure. Body temperature. Skin for abnormal spots. What immunizations do I need?  Vaccines are usually given at various ages, according to a schedule. Your health care provider will recommend vaccines for you based on your age, medical history, and  lifestyle or other factors, such as travel or where you work. What tests do I need? Screening Your health care provider may recommend screening tests for certain conditions. This may include: Pelvic exam and Pap test. Lipid and cholesterol levels. Diabetes screening. This is done by checking your blood sugar (glucose) after you have not eaten for a while (fasting). Hepatitis B test. Hepatitis C test. HIV (human immunodeficiency virus) test. STI (sexually transmitted infection) testing, if you are at risk. BRCA-related cancer screening. This may be done if you have a family history of breast, ovarian, tubal, or peritoneal cancers. Talk with your health care provider about your test results, treatment options, and if necessary, the need for more tests. Follow these instructions at home: Eating and drinking  Eat a healthy diet that includes fresh fruits and vegetables, whole grains, lean protein, and low-fat dairy products. Take vitamin and mineral supplements as recommended by your health care provider. Do not drink alcohol if: Your health care provider tells you not to drink. You are pregnant, may be pregnant, or are planning to become pregnant. If you drink alcohol: Limit how much you have to 0-1 drink a day. Know how much alcohol is in your drink. In the [place] one drink equals one 12 oz bottle of beer (355 mL), one 5 oz glass of wine (148 mL), or one 1 oz glass of hard liquor (44 mL). Lifestyle Brush your teeth every morning and night with fluoride toothpaste. Floss one time each day. Exercise for at least 30 minutes 5 or more days each week. Do not use any products that contain nicotine or tobacco. These products include cigarettes, chewing tobacco, and vaping devices, such as e-cigarettes.  If you need help quitting, ask your health care provider. Do not use drugs. If you are sexually active, practice safe sex. Use a condom or other form of protection to prevent STIs. If you do not  wish to become pregnant, use a form of birth control. If you plan to become pregnant, see your health care provider for a prepregnancy visit. Find healthy ways to manage stress, such as: Meditation, yoga, or listening to music. Journaling. Talking to a trusted person. Spending time with friends and family. Minimize exposure to UV radiation to reduce your risk of skin cancer. Safety Always wear your seat belt while driving or riding in a vehicle. Do not drive: If you have been drinking alcohol. Do not ride with someone who has been drinking. If you have been using any mind-altering substances or drugs. While texting. When you are tired or distracted. Wear a helmet and other protective equipment during sports activities. If you have firearms in your house, make sure you follow all gun safety procedures. Seek help if you have been physically or sexually abused. What's next? Go to your health care provider once a year for an annual wellness visit. Ask your health care provider how often you should have your eyes and teeth checked. Stay up to date on all vaccines. This information is not intended to replace advice given to you by your health care provider. Make sure you discuss any questions you have with your health care provider. Document Revised: 05/12/2021 Document Reviewed: 05/12/2021 Elsevier Patient Education  2024 [employer]  Insomnia Insomnia is a sleep disorder that makes it difficult to fall asleep or stay asleep. Insomnia can cause fatigue, low energy, difficulty concentrating, mood swings, and poor performance at work or school. There are three different ways to classify insomnia: Difficulty falling asleep. Difficulty staying asleep. Waking up too early in the morning. Any type of insomnia can be long-term (chronic) or short-term (acute). Both are common. Short-term insomnia usually lasts for 3 months or less. Chronic insomnia occurs at least three times a week for longer  than 3 months. What are the causes? Insomnia may be caused by another condition, situation, or substance, such as: Having certain mental health conditions, such as anxiety and depression. Using caffeine, alcohol, tobacco, or drugs. Having gastrointestinal conditions, such as gastroesophageal reflux disease (GERD). Having certain medical conditions. These include: Asthma. Alzheimer's disease. Stroke. Chronic pain. An overactive thyroid  gland (hyperthyroidism). Other sleep disorders, such as restless legs syndrome and sleep apnea. Menopause. Sometimes, the cause of insomnia may not be known. What increases the risk? Risk factors for insomnia include: Gender. Females are affected more often than males. Age. Insomnia is more common as people get older. Stress and certain medical and mental health conditions. Lack of exercise. Having an irregular work schedule. This may include working night shifts and traveling between different time zones. What are the signs or symptoms? If you have insomnia, the main symptom is having trouble falling asleep or having trouble staying asleep. This may lead to other symptoms, such as: Feeling tired or having low energy. Feeling nervous about going to sleep. Not feeling rested in the morning. Having trouble concentrating. Feeling irritable, anxious, or depressed. How is this diagnosed? This condition may be diagnosed based on: Your symptoms and medical history. Your health care provider may ask about: Your sleep habits. Any medical conditions you have. Your mental health. A physical exam. How is this treated? Treatment for insomnia depends on the cause. Treatment may focus on  treating an underlying condition that is causing the insomnia. Treatment may also include: Medicines to help you sleep. Counseling or therapy. Lifestyle adjustments to help you sleep better. Follow these instructions at home: Eating and drinking  Limit or avoid alcohol,  caffeinated beverages, and products that contain nicotine and tobacco, especially close to bedtime. These can disrupt your sleep. Do not eat a large meal or eat spicy foods right before bedtime. This can lead to digestive discomfort that can make it hard for you to sleep. Sleep habits  Keep a sleep diary to help you and your health care provider figure out what could be causing your insomnia. Write down: When you sleep. When you wake up during the night. How well you sleep and how rested you feel the next day. Any side effects of medicines you are taking. What you eat and drink. Make your bedroom a dark, comfortable place where it is easy to fall asleep. Put up shades or blackout curtains to block light from outside. Use a white noise machine to block noise. Keep the temperature cool. Limit screen use before bedtime. This includes: Not watching TV. Not using your smartphone, tablet, or computer. Stick to a routine that includes going to bed and waking up at the same times every day and night. This can help you fall asleep faster. Consider making a quiet activity, such as reading, part of your nighttime routine. Try to avoid taking naps during the day so that you sleep better at night. Get out of bed if you are still awake after 15 minutes of trying to sleep. Keep the lights down, but try reading or doing a quiet activity. When you feel sleepy, go back to bed. General instructions Take over-the-counter and prescription medicines only as told by your health care provider. Exercise regularly as told by your health care provider. However, avoid exercising in the hours right before bedtime. Use relaxation techniques to manage stress. Ask your health care provider to suggest some techniques that may work well for you. These may include: Breathing exercises. Routines to release muscle tension. Visualizing peaceful scenes. Make sure that you drive carefully. Do not drive if you feel very  sleepy. Keep all follow-up visits. This is important. Contact a health care provider if: You are tired throughout the day. You have trouble in your daily routine due to sleepiness. You continue to have sleep problems, or your sleep problems get worse. Get help right away if: You have thoughts about hurting yourself or someone else. Get help right away if you feel like you may hurt yourself or others, or have thoughts about taking your own life. Go to your nearest emergency room or: Call 911. Call the National Suicide Prevention Lifeline at 424-792-6082 or 988. This is open 24 hours a day. Text the Crisis Text Line at 863-797-6929. Summary Insomnia is a sleep disorder that makes it difficult to fall asleep or stay asleep. Insomnia can be long-term (chronic) or short-term (acute). Treatment for insomnia depends on the cause. Treatment may focus on treating an underlying condition that is causing the insomnia. Keep a sleep diary to help you and your health care provider figure out what could be causing your insomnia. This information is not intended to replace advice given to you by your health care provider. Make sure you discuss any questions you have with your health care provider. Document Revised: 10/25/2021 Document Reviewed: 10/25/2021 Elsevier Patient Education  2024 [employer].

## 2024-08-01 NOTE — Progress Notes (Signed)
 Subjective:  Patient ID: Paige Stout, female    DOB: [birth year]  Age: 33 y.o. MRN: [id number]  CC:  Chief Complaint  Patient presents with   Annual Exam    Pt is doing well, notes a bit of restlessness when trying to sleep at night but mild. Pt is not fasting     HPI Paige Stout presents for Annual Exam  PCP, me GYN, Dr. Armond, last visit in September 2024, plans to schedule follow up. Trying to conceive. Plan for infertility treatment. LMP end of July. 1 week late for menses.   Prediabetes with obesity Discussed at her last physical.  Had been under care of healthy weight and wellness previously, on metformin , phentermine , myalgias with metformin . No current meds.  Lab Results  Component Value Date   HGBA1C 5.9 03/18/2022   Wt Readings from Last 3 Encounters:  08/01/24 221 lb 3.2 oz (100.3 kg)  09/23/23 215 lb (97.5 kg)  09/23/22 211 lb 6.4 oz (95.9 kg)   Vitamin D  deficiency Previously treated with 50,000 unit dosing.  2000 units/day at her physical in 2023, low again at that time.  No recent testing. Had been off supplement until 2 weeks ago - taking ?5000iu daily.  Last vitamin D  Lab Results  Component Value Date   VD25OH 15.47 (L) 09/23/2022   Insomnia Discussed at her physical in 2023.  Previously treated with trazodone , holistic approach, no relief with melatonin.  Had discussed meeting with sleep specialist with prior daytime somnolence, snoring.  Prior nocturia improved with pelvic floor therapy.  Did not meet with sleep specialist. Snoring improved. Denies daytime sleepiness. Some trouble with sleep onset. Light sleeper. Better with active day.      08/01/2024    2:30 PM 09/23/2022    8:53 AM 03/18/2022    9:38 AM 02/04/2022   10:59 AM 01/13/2022   11:38 AM  Depression screen PHQ 2/9  Decreased Interest 0 0 0 0 0  Down, Depressed, Hopeless 0 0 0 0 0  PHQ - 2 Score 0 0 0 0 0  Altered sleeping 3 2   0  Tired, decreased energy 0 0   0  Change  in appetite 0 0   0  Feeling bad or failure about yourself  0 0   0  Trouble concentrating 0 0   0  Moving slowly or fidgety/restless 0 0   0  Suicidal thoughts 0 0   0  PHQ-9 Score 3 2   0  Difficult doing work/chores Not difficult at all    Not difficult at all    Health Maintenance  Topic Date Due   COVID-19 Vaccine (1 - 2024-25 season) 08/17/2024 (Originally 07/29/2024)   DTaP/Tdap/Td (2 - Td or Tdap) 08/01/2025 (Originally 05/17/2021)   Hepatitis B Vaccines 19-59 Average Risk (1 of 3 - 19+ 3-dose series) 08/01/2025 (Originally 12/31/2007)   HPV VACCINES (1 - 3-dose SCDM series) 08/01/2025 (Originally 12/31/2015)   Hepatitis C Screening  08/01/2025 (Originally 12/30/2006)   Cervical Cancer Screening (HPV/Pap Cotest)  01/15/2025   HIV Screening  Completed   Pneumococcal Vaccine  Aged Out   Meningococcal B Vaccine  Aged Out  Cervical cancer screening with OB/GYN, Dr. Armond.  Exam last year.  Prior diagnosis of PCOS. Had pap last year.   Immunization History  Administered Date(s) Administered   Tdap 05/18/2011  Flu vaccine, tdap declined.   No results found.  Wears glasses, routine follow-up with optho in past year.  Dental: appt next month.   Alcohol:none  Tobacco: none  Exercise: doing better - walking 10k steps per day past week.    History Patient Active Problem List   Diagnosis Date Noted   Vitamin D  deficiency 04/28/2020   Insulin  resistance 04/28/2020   Primary insomnia 08/10/2019   Menorrhagia 10/04/2018   At risk for infertility 10/04/2018   Class 1 obesity with serious comorbidity and body mass index (BMI) of 31.0 to 31.9 in adult 10/04/2018   PCOS (polycystic ovarian syndrome) suspected 10/04/2018   Pelvic pain 09/29/2018   Uterine leiomyoma 09/29/2018   Dysmenorrhea 09/19/2018   Past Medical History:  Diagnosis Date   Constipation    Dysmenorrhea 09/19/2018   Female infertility    Headache    Joint pain    Menorrhagia 10/04/2018   PCOS (polycystic  ovarian syndrome)    Shingles 2013   Uterine leiomyoma 09/29/2018   Vitamin B 12 deficiency    Vitamin D  deficiency    Past Surgical History:  Procedure Laterality Date   LAPAROSCOPY N/A 11/12/2018   Procedure: LAPAROSCOPY OPERATIVE, Peritoneal Biopsies;  Surgeon: Raeanne Shanda SQUIBB, MD;  Location: [facility];  Service: Gynecology;  Laterality: N/A;   No Known Allergies Prior to Admission medications   Medication Sig Start Date End Date Taking? Authorizing Provider  ELDERBERRY PO Take by mouth.   Yes [provider]  ascorbic acid (VITAMIN C) 500 MG tablet Take 500 mg by mouth daily. Patient not taking: Reported on 08/01/2024    [provider]  Prenatal Vit-Fe Fumarate-FA (PRENATAL MULTIVITAMIN) TABS tablet Take 1 tablet by mouth daily at 12 noon. Pt does not take consistent Patient not taking: Reported on 08/01/2024    [provider]  Vitamin D , Ergocalciferol , (DRISDOL ) 1.25 MG (50000 UNIT) CAPS capsule Take 1 capsule (50,000 Units total) by mouth every 7 (seven) days. Patient not taking: Reported on 08/01/2024 10/01/22   Levora Reyes SAUNDERS, MD   Social History   Socioeconomic History   Marital status: Married    Spouse name: Lesieli Bresee   Number of children: 0   Years of education: Not on file   Highest education level: Not on file  Occupational History   Not on file  Tobacco Use   Smoking status: Never   Smokeless tobacco: Never  Vaping Use   Vaping status: Never Used  Substance and Sexual Activity   Alcohol use: No   Drug use: No   Sexual activity: Yes    Birth control/protection: None  Other Topics Concern   Not on file  Social History Narrative   Not on file   Social Drivers of Health   Financial Resource Strain: Not on file  Food Insecurity: Not on file  Transportation Needs: Not on file  Physical Activity: Not on file  Stress: Not on file  Social Connections: Not on file  Intimate Partner Violence: Not on file    Review of  Systems  13 point review of systems per patient health survey noted.  Negative other than as indicated above or in HPI.   Objective:   Vitals:   08/01/24 1355  BP: 118/68  Pulse: 91  Resp: 13  Temp: 98.4 F (36.9 C)  TempSrc: Temporal  SpO2: 97%  Weight: 221 lb 3.2 oz (100.3 kg)  Height: 5' 6 (1.676 m)     Physical Exam Constitutional:      Appearance: She is well-developed.  HENT:     Head: Normocephalic and atraumatic.  Right Ear: External ear normal.     Left Ear: External ear normal.  Eyes:     Conjunctiva/sclera: Conjunctivae normal.     Pupils: Pupils are equal, round, and reactive to light.  Neck:     Thyroid : No thyromegaly.  Cardiovascular:     Rate and Rhythm: Normal rate and regular rhythm.     Heart sounds: Normal heart sounds. No murmur heard. Pulmonary:     Effort: Pulmonary effort is normal. No respiratory distress.     Breath sounds: Normal breath sounds. No wheezing.  Abdominal:     General: Bowel sounds are normal.     Palpations: Abdomen is soft.     Tenderness: There is no abdominal tenderness.  Musculoskeletal:        General: No tenderness. Normal range of motion.     Cervical back: Normal range of motion and neck supple.  Lymphadenopathy:     Cervical: No cervical adenopathy.  Skin:    General: Skin is warm and dry.     Findings: No rash.  Neurological:     Mental Status: She is alert and oriented to person, place, and time.  Psychiatric:        Behavior: Behavior normal.        Thought Content: Thought content normal.     Results for orders placed or performed in visit on 08/01/24  POCT urine pregnancy   Collection Time: 08/01/24  2:49 PM  Result Value Ref Range   Preg Test, Ur Negative Negative      Assessment & Plan:  AMADI YOSHINO is a 33 y.o. female . Annual physical exam  - -anticipatory guidance as below in AVS, screening labs above. Health maintenance items as above in HPI discussed/recommended as  applicable.   Vitamin D  deficiency - Plan: Vitamin D  (25 hydroxy)  - Check labs and then adjust dose accordingly, recently restarted supplementation.  Insomnia, unspecified type  - Handout on sleep hygiene with RTC precautions if ineffective with information from that handout.  Denies significant daytime somnolence or snoring at this time.  Prediabetes - Plan: Hemoglobin A1c  Screening for thyroid  disorder - Plan: TSH  Screening, anemia, deficiency, iron  Screening for hyperlipidemia - Plan: Comprehensive metabolic panel with GFR, Lipid panel  Delayed menses - Plan: POCT urine pregnancy  - Negative hCG in office, option to repeat testing in 1 week.  Follow-up with GYN.  No orders of the defined types were placed in this encounter.  Patient Instructions  Keep follow-up with gynecology as planned. I will check your labs again today including the prediabetes test and vitamin D  test to see if we need to change the treatment.  Keep up the good work with walking.  If any other concerns on labs I will let you know.  See information below on insomnia, follow-up if that persists or not improved with treatment options below.  Take care!  Preventive Care 35-26 Years Old, Female Preventive care refers to lifestyle choices and visits with your health care provider that can promote health and wellness. Preventive care visits are also called wellness exams. What can I expect for my preventive care visit? Counseling During your preventive care visit, your health care provider may ask about your: Medical history, including: Past medical problems. Family medical history. Pregnancy history. Current health, including: Menstrual cycle. Method of birth control. Emotional well-being. Home life and relationship well-being. Sexual activity and sexual health. Lifestyle, including: Alcohol, nicotine or tobacco, and drug use. Access to  firearms. Diet, exercise, and sleep habits. Work and work  Astronomer. Sunscreen use. Safety issues such as seatbelt and bike helmet use. Physical exam Your health care provider may check your: Height and weight. These may be used to calculate your BMI (body mass index). BMI is a measurement that tells if you are at a healthy weight. Waist circumference. This measures the distance around your waistline. This measurement also tells if you are at a healthy weight and may help predict your risk of certain diseases, such as type 2 diabetes and high blood pressure. Heart rate and blood pressure. Body temperature. Skin for abnormal spots. What immunizations do I need?  Vaccines are usually given at various ages, according to a schedule. Your health care provider will recommend vaccines for you based on your age, medical history, and lifestyle or other factors, such as travel or where you work. What tests do I need? Screening Your health care provider may recommend screening tests for certain conditions. This may include: Pelvic exam and Pap test. Lipid and cholesterol levels. Diabetes screening. This is done by checking your blood sugar (glucose) after you have not eaten for a while (fasting). Hepatitis B test. Hepatitis C test. HIV (human immunodeficiency virus) test. STI (sexually transmitted infection) testing, if you are at risk. BRCA-related cancer screening. This may be done if you have a family history of breast, ovarian, tubal, or peritoneal cancers. Talk with your health care provider about your test results, treatment options, and if necessary, the need for more tests. Follow these instructions at home: Eating and drinking  Eat a healthy diet that includes fresh fruits and vegetables, whole grains, lean protein, and low-fat dairy products. Take vitamin and mineral supplements as recommended by your health care provider. Do not drink alcohol if: Your health care provider tells you not to drink. You are pregnant, may be pregnant, or are  planning to become pregnant. If you drink alcohol: Limit how much you have to 0-1 drink a day. Know how much alcohol is in your drink. In the [place] one drink equals one 12 oz bottle of beer (355 mL), one 5 oz glass of wine (148 mL), or one 1 oz glass of hard liquor (44 mL). Lifestyle Brush your teeth every morning and night with fluoride toothpaste. Floss one time each day. Exercise for at least 30 minutes 5 or more days each week. Do not use any products that contain nicotine or tobacco. These products include cigarettes, chewing tobacco, and vaping devices, such as e-cigarettes. If you need help quitting, ask your health care provider. Do not use drugs. If you are sexually active, practice safe sex. Use a condom or other form of protection to prevent STIs. If you do not wish to become pregnant, use a form of birth control. If you plan to become pregnant, see your health care provider for a prepregnancy visit. Find healthy ways to manage stress, such as: Meditation, yoga, or listening to music. Journaling. Talking to a trusted person. Spending time with friends and family. Minimize exposure to UV radiation to reduce your risk of skin cancer. Safety Always wear your seat belt while driving or riding in a vehicle. Do not drive: If you have been drinking alcohol. Do not ride with someone who has been drinking. If you have been using any mind-altering substances or drugs. While texting. When you are tired or distracted. Wear a helmet and other protective equipment during sports activities. If you have firearms in your house, make sure you  follow all gun safety procedures. Seek help if you have been physically or sexually abused. What's next? Go to your health care provider once a year for an annual wellness visit. Ask your health care provider how often you should have your eyes and teeth checked. Stay up to date on all vaccines. This information is not intended to replace advice given  to you by your health care provider. Make sure you discuss any questions you have with your health care provider. Document Revised: 05/12/2021 Document Reviewed: 05/12/2021 Elsevier Patient Education  2024 Else[employer] Inc.  Insomnia Insomnia is a sleep disorder that makes it difficult to fall asleep or stay asleep. Insomnia can cause fatigue, low energy, difficulty concentrating, mood swings, and poor performance at work or school. There are three different ways to classify insomnia: Difficulty falling asleep. Difficulty staying asleep. Waking up too early in the morning. Any type of insomnia can be long-term (chronic) or short-term (acute). Both are common. Short-term insomnia usually lasts for 3 months or less. Chronic insomnia occurs at least three times a week for longer than 3 months. What are the causes? Insomnia may be caused by another condition, situation, or substance, such as: Having certain mental health conditions, such as anxiety and depression. Using caffeine, alcohol, tobacco, or drugs. Having gastrointestinal conditions, such as gastroesophageal reflux disease (GERD). Having certain medical conditions. These include: Asthma. Alzheimer's disease. Stroke. Chronic pain. An overactive thyroid  gland (hyperthyroidism). Other sleep disorders, such as restless legs syndrome and sleep apnea. Menopause. Sometimes, the cause of insomnia may not be known. What increases the risk? Risk factors for insomnia include: Gender. Females are affected more often than males. Age. Insomnia is more common as people get older. Stress and certain medical and mental health conditions. Lack of exercise. Having an irregular work schedule. This may include working night shifts and traveling between different time zones. What are the signs or symptoms? If you have insomnia, the main symptom is having trouble falling asleep or having trouble staying asleep. This may lead to other symptoms, such  as: Feeling tired or having low energy. Feeling nervous about going to sleep. Not feeling rested in the morning. Having trouble concentrating. Feeling irritable, anxious, or depressed. How is this diagnosed? This condition may be diagnosed based on: Your symptoms and medical history. Your health care provider may ask about: Your sleep habits. Any medical conditions you have. Your mental health. A physical exam. How is this treated? Treatment for insomnia depends on the cause. Treatment may focus on treating an underlying condition that is causing the insomnia. Treatment may also include: Medicines to help you sleep. Counseling or therapy. Lifestyle adjustments to help you sleep better. Follow these instructions at home: Eating and drinking  Limit or avoid alcohol, caffeinated beverages, and products that contain nicotine and tobacco, especially close to bedtime. These can disrupt your sleep. Do not eat a large meal or eat spicy foods right before bedtime. This can lead to digestive discomfort that can make it hard for you to sleep. Sleep habits  Keep a sleep diary to help you and your health care provider figure out what could be causing your insomnia. Write down: When you sleep. When you wake up during the night. How well you sleep and how rested you feel the next day. Any side effects of medicines you are taking. What you eat and drink. Make your bedroom a dark, comfortable place where it is easy to fall asleep. Put up shades or blackout curtains to block  light from outside. Use a white noise machine to block noise. Keep the temperature cool. Limit screen use before bedtime. This includes: Not watching TV. Not using your smartphone, tablet, or computer. Stick to a routine that includes going to bed and waking up at the same times every day and night. This can help you fall asleep faster. Consider making a quiet activity, such as reading, part of your nighttime routine. Try to  avoid taking naps during the day so that you sleep better at night. Get out of bed if you are still awake after 15 minutes of trying to sleep. Keep the lights down, but try reading or doing a quiet activity. When you feel sleepy, go back to bed. General instructions Take over-the-counter and prescription medicines only as told by your health care provider. Exercise regularly as told by your health care provider. However, avoid exercising in the hours right before bedtime. Use relaxation techniques to manage stress. Ask your health care provider to suggest some techniques that may work well for you. These may include: Breathing exercises. Routines to release muscle tension. Visualizing peaceful scenes. Make sure that you drive carefully. Do not drive if you feel very sleepy. Keep all follow-up visits. This is important. Contact a health care provider if: You are tired throughout the day. You have trouble in your daily routine due to sleepiness. You continue to have sleep problems, or your sleep problems get worse. Get help right away if: You have thoughts about hurting yourself or someone else. Get help right away if you feel like you may hurt yourself or others, or have thoughts about taking your own life. Go to your nearest emergency room or: Call 911. Call the National Suicide Prevention Lifeline at 6262770383 or 988. This is open 24 hours a day. Text the Crisis Text Line at (712)621-0532. Summary Insomnia is a sleep disorder that makes it difficult to fall asleep or stay asleep. Insomnia can be long-term (chronic) or short-term (acute). Treatment for insomnia depends on the cause. Treatment may focus on treating an underlying condition that is causing the insomnia. Keep a sleep diary to help you and your health care provider figure out what could be causing your insomnia. This information is not intended to replace advice given to you by your health care provider. Make sure you discuss any  questions you have with your health care provider. Document Revised: 10/25/2021 Document Reviewed: 10/25/2021 Elsevier Patient Education  2024 [employer] Inc.    Signed,   Reyes Pines, MD [facility], [facility] 08/01/24 3:07 PM

## 2024-08-02 LAB — LIPID PANEL
Cholesterol: 187 mg/dL (ref 0–200)
HDL: 65.3 mg/dL (ref >39.00)
LDL Cholesterol: 100 mg/dL — ABNORMAL HIGH (ref 0–99)
NonHDL: 121.2
Total CHOL/HDL Ratio: 3
Triglycerides: 106 mg/dL (ref 0.0–149.0)
VLDL: 21.2 mg/dL (ref 0.0–40.0)

## 2024-08-02 LAB — COMPREHENSIVE METABOLIC PANEL WITH GFR
ALT: 19 U/L (ref 0–35)
AST: 22 U/L (ref 0–37)
Albumin: 4.5 g/dL (ref 3.5–5.2)
Alkaline Phosphatase: 64 U/L (ref 39–117)
BUN: 9 mg/dL (ref 6–23)
CO2: 29 meq/L (ref 19–32)
Calcium: 9.4 mg/dL (ref 8.4–10.5)
Chloride: 100 meq/L (ref 96–112)
Creatinine, Ser: 0.87 mg/dL (ref 0.40–1.20)
GFR: 86.22 mL/min (ref >60.00)
Glucose, Bld: 94 mg/dL (ref 70–99)
Potassium: 3.8 meq/L (ref 3.5–5.1)
Sodium: 138 meq/L (ref 135–145)
Total Bilirubin: 0.6 mg/dL (ref 0.2–1.2)
Total Protein: 8.3 g/dL (ref 6.0–8.3)

## 2024-08-02 LAB — HEMOGLOBIN A1C: Hgb A1c MFr Bld: 6.1 % (ref 4.6–6.5)

## 2024-08-02 LAB — TSH: TSH: 0.84 u[IU]/mL (ref 0.35–5.50)

## 2024-08-02 LAB — VITAMIN D 25 HYDROXY (VIT D DEFICIENCY, FRACTURES): VITD: 21.02 ng/mL — ABNORMAL LOW (ref 30.00–100.00)

## 2024-08-06 ENCOUNTER — Ambulatory Visit: Payer: Self-pay | Admitting: Family Medicine

## 2024-08-07 ENCOUNTER — Encounter: Admitting: Family Medicine

## 2025-01-29 ENCOUNTER — Ambulatory Visit: Admitting: Family Medicine
